# Patient Record
Sex: Female | Born: 1950 | Race: Black or African American | Hispanic: No | Marital: Single | Smoking: Never smoker
Health system: Southern US, Community
[De-identification: ages and names within clinical notes are randomized; demographics above are authoritative.]

## PROBLEM LIST (undated history)

## (undated) ENCOUNTER — Ambulatory Visit: Admission: EM | Payer: Medicare PPO | Source: Home / Self Care

## (undated) DIAGNOSIS — R42 Dizziness and giddiness: Secondary | ICD-10-CM

## (undated) DIAGNOSIS — J45909 Unspecified asthma, uncomplicated: Secondary | ICD-10-CM

## (undated) DIAGNOSIS — M810 Age-related osteoporosis without current pathological fracture: Secondary | ICD-10-CM

## (undated) DIAGNOSIS — I639 Cerebral infarction, unspecified: Secondary | ICD-10-CM

## (undated) DIAGNOSIS — I10 Essential (primary) hypertension: Secondary | ICD-10-CM

## (undated) HISTORY — PX: LUNG SURGERY: SHX703

## (undated) HISTORY — DX: Cerebral infarction, unspecified: I63.9

## (undated) HISTORY — DX: Age-related osteoporosis without current pathological fracture: M81.0

---

## 2021-10-20 ENCOUNTER — Other Ambulatory Visit: Payer: Self-pay | Admitting: Family Medicine

## 2021-10-20 DIAGNOSIS — M810 Age-related osteoporosis without current pathological fracture: Secondary | ICD-10-CM

## 2022-02-28 DIAGNOSIS — E559 Vitamin D deficiency, unspecified: Secondary | ICD-10-CM | POA: Diagnosis not present

## 2022-03-02 DIAGNOSIS — F411 Generalized anxiety disorder: Secondary | ICD-10-CM | POA: Diagnosis not present

## 2022-03-02 DIAGNOSIS — F331 Major depressive disorder, recurrent, moderate: Secondary | ICD-10-CM | POA: Diagnosis not present

## 2022-03-02 DIAGNOSIS — K219 Gastro-esophageal reflux disease without esophagitis: Secondary | ICD-10-CM | POA: Diagnosis not present

## 2022-03-02 DIAGNOSIS — G4700 Insomnia, unspecified: Secondary | ICD-10-CM | POA: Diagnosis not present

## 2022-03-02 DIAGNOSIS — E559 Vitamin D deficiency, unspecified: Secondary | ICD-10-CM | POA: Diagnosis not present

## 2022-03-02 DIAGNOSIS — I10 Essential (primary) hypertension: Secondary | ICD-10-CM | POA: Diagnosis not present

## 2022-03-03 ENCOUNTER — Inpatient Hospital Stay: Admission: RE | Admit: 2022-03-03 | Payer: Self-pay | Source: Ambulatory Visit

## 2022-04-18 DIAGNOSIS — F331 Major depressive disorder, recurrent, moderate: Secondary | ICD-10-CM | POA: Diagnosis not present

## 2022-04-18 DIAGNOSIS — I10 Essential (primary) hypertension: Secondary | ICD-10-CM | POA: Diagnosis not present

## 2022-04-18 DIAGNOSIS — G4700 Insomnia, unspecified: Secondary | ICD-10-CM | POA: Diagnosis not present

## 2022-04-18 DIAGNOSIS — F411 Generalized anxiety disorder: Secondary | ICD-10-CM | POA: Diagnosis not present

## 2022-05-22 ENCOUNTER — Emergency Department (HOSPITAL_COMMUNITY): Payer: Medicare PPO

## 2022-05-22 ENCOUNTER — Encounter (HOSPITAL_COMMUNITY): Payer: Self-pay

## 2022-05-22 ENCOUNTER — Observation Stay (HOSPITAL_COMMUNITY)
Admission: EM | Admit: 2022-05-22 | Discharge: 2022-05-23 | Disposition: A | Discharge status: Home / Self Care | Payer: Medicare PPO | Attending: Family Medicine | Admitting: Family Medicine

## 2022-05-22 ENCOUNTER — Other Ambulatory Visit: Payer: Self-pay

## 2022-05-22 ENCOUNTER — Observation Stay (HOSPITAL_COMMUNITY): Payer: Medicare PPO

## 2022-05-22 ENCOUNTER — Inpatient Hospital Stay (HOSPITAL_BASED_OUTPATIENT_CLINIC_OR_DEPARTMENT_OTHER): Payer: Medicare PPO

## 2022-05-22 DIAGNOSIS — R42 Dizziness and giddiness: Secondary | ICD-10-CM | POA: Diagnosis not present

## 2022-05-22 DIAGNOSIS — I6389 Other cerebral infarction: Secondary | ICD-10-CM

## 2022-05-22 DIAGNOSIS — R29898 Other symptoms and signs involving the musculoskeletal system: Secondary | ICD-10-CM | POA: Diagnosis not present

## 2022-05-22 DIAGNOSIS — J45909 Unspecified asthma, uncomplicated: Secondary | ICD-10-CM | POA: Insufficient documentation

## 2022-05-22 DIAGNOSIS — K219 Gastro-esophageal reflux disease without esophagitis: Secondary | ICD-10-CM | POA: Diagnosis not present

## 2022-05-22 DIAGNOSIS — I10 Essential (primary) hypertension: Secondary | ICD-10-CM | POA: Insufficient documentation

## 2022-05-22 DIAGNOSIS — R9389 Abnormal findings on diagnostic imaging of other specified body structures: Secondary | ICD-10-CM | POA: Diagnosis not present

## 2022-05-22 DIAGNOSIS — R41841 Cognitive communication deficit: Secondary | ICD-10-CM | POA: Diagnosis not present

## 2022-05-22 DIAGNOSIS — Z79899 Other long term (current) drug therapy: Secondary | ICD-10-CM | POA: Insufficient documentation

## 2022-05-22 DIAGNOSIS — R937 Abnormal findings on diagnostic imaging of other parts of musculoskeletal system: Secondary | ICD-10-CM

## 2022-05-22 DIAGNOSIS — R8281 Pyuria: Secondary | ICD-10-CM | POA: Diagnosis not present

## 2022-05-22 DIAGNOSIS — I639 Cerebral infarction, unspecified: Principal | ICD-10-CM | POA: Insufficient documentation

## 2022-05-22 DIAGNOSIS — E041 Nontoxic single thyroid nodule: Secondary | ICD-10-CM | POA: Insufficient documentation

## 2022-05-22 DIAGNOSIS — R531 Weakness: Secondary | ICD-10-CM | POA: Diagnosis not present

## 2022-05-22 DIAGNOSIS — I6522 Occlusion and stenosis of left carotid artery: Secondary | ICD-10-CM | POA: Diagnosis not present

## 2022-05-22 HISTORY — DX: Dizziness and giddiness: R42

## 2022-05-22 HISTORY — DX: Essential (primary) hypertension: I10

## 2022-05-22 HISTORY — DX: Unspecified asthma, uncomplicated: J45.909

## 2022-05-22 LAB — CBC
HCT: 44.2 % (ref 36.0–46.0)
HCT: 41.3 % (ref 36.0–46.0)
Hemoglobin: 14.2 g/dL (ref 12.0–15.0)
Hemoglobin: 13.7 g/dL (ref 12.0–15.0)
MCH: 29.5 pg (ref 26.0–34.0)
MCH: 30.5 pg (ref 26.0–34.0)
MCHC: 32.1 g/dL (ref 30.0–36.0)
MCHC: 33.2 g/dL (ref 30.0–36.0)
MCV: 91.9 fL (ref 80.0–100.0)
MCV: 92 fL (ref 80.0–100.0)
Platelets: 153 10*3/uL (ref 150–400)
Platelets: 141 10*3/uL — ABNORMAL LOW (ref 150–400)
RBC: 4.81 MIL/uL (ref 3.87–5.11)
RBC: 4.49 MIL/uL (ref 3.87–5.11)
RDW: 13.6 % (ref 11.5–15.5)
RDW: 13.5 % (ref 11.5–15.5)
WBC: 4.6 10*3/uL (ref 4.0–10.5)
WBC: 4.4 10*3/uL (ref 4.0–10.5)
nRBC: 0 % (ref 0.0–0.2)
nRBC: 0 % (ref 0.0–0.2)

## 2022-05-22 LAB — COMPREHENSIVE METABOLIC PANEL
ALT: 42 U/L (ref 0–44)
ALT: 37 U/L (ref 0–44)
AST: 30 U/L (ref 15–41)
AST: 31 U/L (ref 15–41)
Albumin: 4.1 g/dL (ref 3.5–5.0)
Albumin: 3.7 g/dL (ref 3.5–5.0)
Alkaline Phosphatase: 62 U/L (ref 38–126)
Alkaline Phosphatase: 58 U/L (ref 38–126)
Anion gap: 7 (ref 5–15)
Anion gap: 6 (ref 5–15)
BUN: 34 mg/dL — ABNORMAL HIGH (ref 8–23)
BUN: 30 mg/dL — ABNORMAL HIGH (ref 8–23)
CO2: 29 mmol/L (ref 22–32)
CO2: 28 mmol/L (ref 22–32)
Calcium: 10.3 mg/dL (ref 8.9–10.3)
Calcium: 9.6 mg/dL (ref 8.9–10.3)
Chloride: 108 mmol/L (ref 98–111)
Chloride: 108 mmol/L (ref 98–111)
Creatinine, Ser: 0.79 mg/dL (ref 0.44–1.00)
Creatinine, Ser: 0.73 mg/dL (ref 0.44–1.00)
GFR, Estimated: >60 mL/min (ref >60)
GFR, Estimated: >60 mL/min (ref >60)
Glucose, Bld: 127 mg/dL — ABNORMAL HIGH (ref 70–99)
Glucose, Bld: 86 mg/dL (ref 70–99)
Potassium: 3.7 mmol/L (ref 3.5–5.1)
Potassium: 3.8 mmol/L (ref 3.5–5.1)
Sodium: 144 mmol/L (ref 135–145)
Sodium: 142 mmol/L (ref 135–145)
Total Bilirubin: 0.6 mg/dL (ref 0.3–1.2)
Total Bilirubin: 0.8 mg/dL (ref 0.3–1.2)
Total Protein: 8.2 g/dL — ABNORMAL HIGH (ref 6.5–8.1)
Total Protein: 7.3 g/dL (ref 6.5–8.1)

## 2022-05-22 LAB — DIFFERENTIAL
Abs Immature Granulocytes: 0.01 10*3/uL (ref 0.00–0.07)
Abs Immature Granulocytes: 0.02 10*3/uL (ref 0.00–0.07)
Basophils Absolute: 0 10*3/uL (ref 0.0–0.1)
Basophils Absolute: 0 10*3/uL (ref 0.0–0.1)
Basophils Relative: 1 %
Basophils Relative: 1 %
Eosinophils Absolute: 0.2 10*3/uL (ref 0.0–0.5)
Eosinophils Absolute: 0.2 10*3/uL (ref 0.0–0.5)
Eosinophils Relative: 3 %
Eosinophils Relative: 4 %
Immature Granulocytes: 0 %
Immature Granulocytes: 1 %
Lymphocytes Relative: 27 %
Lymphocytes Relative: 30 %
Lymphs Abs: 1.3 10*3/uL (ref 0.7–4.0)
Lymphs Abs: 1.3 10*3/uL (ref 0.7–4.0)
Monocytes Absolute: 0.5 10*3/uL (ref 0.1–1.0)
Monocytes Absolute: 0.5 10*3/uL (ref 0.1–1.0)
Monocytes Relative: 10 %
Monocytes Relative: 10 %
Neutro Abs: 2.7 10*3/uL (ref 1.7–7.7)
Neutro Abs: 2.4 10*3/uL (ref 1.7–7.7)
Neutrophils Relative %: 59 %
Neutrophils Relative %: 54 %

## 2022-05-22 LAB — RAPID URINE DRUG SCREEN, HOSP PERFORMED
Amphetamines: NOT DETECTED
Barbiturates: NOT DETECTED
Benzodiazepines: NOT DETECTED
Cocaine: NOT DETECTED
Opiates: NOT DETECTED
Tetrahydrocannabinol: NOT DETECTED

## 2022-05-22 LAB — URINALYSIS, ROUTINE W REFLEX MICROSCOPIC
Bilirubin Urine: NEGATIVE
Glucose, UA: NEGATIVE mg/dL
Hgb urine dipstick: NEGATIVE
Ketones, ur: 5 mg/dL — AB
Nitrite: NEGATIVE
Protein, ur: NEGATIVE mg/dL
Specific Gravity, Urine: 1.027 (ref 1.005–1.030)
pH: 5 (ref 5.0–8.0)

## 2022-05-22 LAB — APTT: aPTT: 26 seconds (ref 24–36)

## 2022-05-22 LAB — PROTIME-INR
INR: 1.1 (ref 0.8–1.2)
Prothrombin Time: 13.9 seconds (ref 11.4–15.2)

## 2022-05-22 LAB — MAGNESIUM: Magnesium: 2.1 mg/dL (ref 1.7–2.4)

## 2022-05-22 LAB — ECHOCARDIOGRAM COMPLETE BUBBLE STUDY
AR max vel: 1.93 cm2
AV Peak grad: 9.2 mmHg
Ao pk vel: 1.52 m/s
Area-P 1/2: 4.08 cm2
Calc EF: 65.2 %
S' Lateral: 2.7 cm
Single Plane A2C EF: 67.2 %
Single Plane A4C EF: 62.5 %

## 2022-05-22 LAB — HEMOGLOBIN A1C
Hgb A1c MFr Bld: 5.3 % (ref 4.8–5.6)
Mean Plasma Glucose: 105.41 mg/dL

## 2022-05-22 LAB — LIPID PANEL
Cholesterol: 140 mg/dL (ref 0–200)
HDL: 54 mg/dL (ref >40)
LDL Cholesterol: 76 mg/dL (ref 0–99)
Total CHOL/HDL Ratio: 2.6 RATIO
Triglycerides: 48 mg/dL (ref <150)
VLDL: 10 mg/dL (ref 0–40)

## 2022-05-22 LAB — ETHANOL: Alcohol, Ethyl (B): <10 mg/dL (ref <10)

## 2022-05-22 MED ORDER — IOHEXOL 350 MG/ML SOLN
75.0000 mL | Freq: Once | INTRAVENOUS | Status: AC | PRN
  Administered 2022-05-22: 75 mL via INTRAVENOUS

## 2022-05-22 MED ORDER — ASPIRIN 325 MG PO TABS
325.0000 mg | ORAL_TABLET | Freq: Once | ORAL | Status: AC
  Administered 2022-05-22: 325 mg via ORAL
  Filled 2022-05-22: qty 1

## 2022-05-22 MED ORDER — FAMOTIDINE 20 MG PO TABS
40.0000 mg | ORAL_TABLET | Freq: Every day | ORAL | Status: DC
  Administered 2022-05-22 – 2022-05-23 (×2): 40 mg via ORAL
  Filled 2022-05-22 (×2): qty 2

## 2022-05-22 MED ORDER — STROKE: EARLY STAGES OF RECOVERY BOOK
Freq: Once | Status: DC
  Filled 2022-05-22: qty 1

## 2022-05-22 MED ORDER — HYDRALAZINE HCL 20 MG/ML IJ SOLN: 10.0000 mg | INTRAMUSCULAR | Status: DC | PRN

## 2022-05-22 MED ORDER — ACETAMINOPHEN 325 MG PO TABS: 650.0000 mg | ORAL_TABLET | Freq: Four times a day (QID) | ORAL | Status: DC | PRN

## 2022-05-22 MED ORDER — ACETAMINOPHEN 650 MG RE SUPP: 650.0000 mg | Freq: Four times a day (QID) | RECTAL | Status: DC | PRN

## 2022-05-22 NOTE — Assessment & Plan Note (Signed)
  #)   GERD: documented h/o such; on Pepcid as outpatient.   Plan: continue home Pepcid.     

## 2022-05-22 NOTE — Assessment & Plan Note (Signed)
 #)   Asymptomatic Pyuria: Presenting urinalysis suggestive of pyuria but does not meet quantitative threshold for significance in a female, Additionally, no acute urinary symptoms.  Overall, these findings appear most consistent with asymptomatic and insignificant pyuria, and do not appear to warrant independent antibiotic coverage for such.  Plan: Repeat CBC in the morning.

## 2022-05-22 NOTE — Assessment & Plan Note (Signed)
#)   Acute right upper extremity weakness: Awoke with new onset right upper extremity weakness on the morning of 05/21/2022, a/w LKN of 2200 on 05/20/22, with slight interval improvement in this symptom, but with residual weakness noted, with CT head showing no evidence of acute intracranial process, as further detailed above.  Presentation concerning for acute ischemic stroke.   Patient's case and imaging were d/w the on-call neurologist, Dr.  Thomasena Edis, who conveyed concern for potential stroke, and recommended admission to the hospitalist service for further stroke work-up, including pursuit of MRI brain, as well as further assessment of potential modifiable acute ischemic CVA risk factors. Dr. Thomasena Edis requested that the patient be admitted to [facility] for neurology will formally consult neurology to formally consult, with additional recommendations to follow.  Of note, the patient is not a candidate for tPA nor a candidate for thrombectomy given that she presents outside the window for consideration for either these interventions relative to her last known normal at 2200 on 05/20/2022, as above.  Of note, the patient reportedly possesses modifiable CVA risk factor in the form of a known history of essential hypertension.  Denies any known history of hyperlipidemia, diabetes, obstructive sleep apnea, or paroxysmal atrial fibrillation.  Additionally she is a lifelong non-smoker.  EKG has been ordered, with result currently pending.  Current outpatient antiplatelet/anticoagulant regimen: None. Current outpatient anti-lipid regimen: None.   Will allow for permissive hypertension for 48 hours following onset of acute focal neurologic deficits, with associated parameters further quantified below, and with period of observance of permissive hypertension to end at  2200 on 05/22/21.     Plan: Nursing bedside swallow evaluation x 1 now, and will not initiate oral medications or diet until the patient has passed  this. Head of the bed at 30 degrees. Neuro checks per protocol. VS per protocol. Will allow for permissive hypertension for 48 hours following LKN, during which time will hold home antihypertensive medication, with prn IV hydralazine ordered for systolic blood pressure greater than 220 mmHg or diastolic blood pressure greater than 110 mmHg until 2200 on 05/22/2022. Monitor on telemetry, including monitoring for atrial fibrillation as modifiable risk factor for acute ischemic CVA.   MRI brain has been ordered, with result currently pending.  TTE with  bubble study has been ordered for the morning. Check lipid panel and A1c. PT/OT consults have been ordered to occur in the morning.  CTA head and neck.  Full dose aspirin x1 dose now.  Neurology consulted, as above.  Follow-up result EKG, as above.
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
## 2022-05-22 NOTE — H&P (Signed)
History and Physical    PLEASE NOTE THAT DRAGON DICTATION SOFTWARE WAS USED IN THE CONSTRUCTION OF THIS NOTE.   Tamara Andrews SJG:[id number] DOB: 05/08/1961 DOA: 05/22/2022  PCP: Lewis Moccasin, MD  Patient coming from: home   I have personally briefly reviewed patient's old medical records in [facility]  Chief Complaint: Right upper extremity weakness  HPI: Tamara Andrews is a 71 y.o. female with medical history significant for essential hypertension, GERD, who is admitted to [facility] on 05/22/2022 for further stroke work-up after presenting from home to [facility] ED complaining of right upper extremity weakness.   The patient reports that she awoke around 8 AM on Saturday, 05/21/2022 with new onset right upper extremity weakness, noting that this was not present when she went to sleep around 2200 on 6-23.  Since first noting the right upper extremity weakness, she notes a small amount of interval improvement, but notes that she has not yet back to baseline as it relates to this.  Not associated with any acute focal numbness or paresthesias.  Denies any additional acute focal weakness.  Not associate with any dysphagia, dizziness, vertigo, nausea, vomiting, change in vision, blurry vision, diplopia, word finding difficulties, slurring of speech, facial droop, or headache.    Denies any associated chest pain, shortness of breath, palpitations, diaphoresis, presyncope, or syncope.  Denies any recent subjective fever, chills, rigors, or generalized myalgias.  No recent dysuria, gross hematuria, or change in urinary  Denies any know previous history of stroke, although she reports a history of multiple previous vertigo episodes several months to years ago.  Medical history notable for essential hypertension, although patient denies any known history of hyperlipidemia, diabetes, paroxysmal atrial fibrillation, obstructive sleep apnea, and confirms that she is a lifelong non-smoker.  She  notes compliance with her outpatient valsartan, and confirms that she is on no antiplatelet or anticoagulation medications at home, including no aspirin.  Not currently taking a statin.     ED Course:  Vital signs in the ED were notable for the following: Afebrile; heart rate 55; blood pressure initially noted to be 181/76, with most recent blood pressure 144/89 without interval antihypertensive medications; respiratory rate 15-19, oxygen saturation 97 to 99% on  Labs were notable for the following: CMP notable for the following: Creatinine 0.79, glucose 127, liver enzymes found to be within normal limits.  CBC notable for will with cell count 4600, hemoglobin 14.2, platelet count 153.  INR 1.1.  Urinalysis notable for 11-20 white blood cells, rare bacteria, nitrate negative, specific gravity 1.027.  Serum ethanol level less than 10.  Urinary drug screen ordered, with result currently pending.  Imaging and additional notable ED work-up: EKG ordered, with result currently pending.  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   EDP discussed patient's case and imaging with the on-call neurologist, Dr.  Thomasena Edis, who conveyed concern for potential stroke, and recommended admission to the hospitalist service for further stroke work-up, including pursuit of MRI brain, as well as further assessment of potential modifiable acute ischemic CVA risk factors. Dr. Thomasena Edis requested that the patient be admitted to [facility] for neurology will formally consult neurology to formally consult, with additional recommendations to follow.  Subsequently, the patient was admitted to [facility] for further stroke evaluation.      Review of Systems:  As per HPI otherwise 10 point review of systems negative.   Past Medical  History:  Diagnosis Date   Asthma    Essential hypertension    Vertigo     Past Surgical History:  Procedure Laterality Date   LUNG SURGERY      Social History:  reports that she has never smoked. She has never used smokeless tobacco. She reports that she does not currently use alcohol. She reports that she does not use drugs.   No Known Allergies  History reviewed. No pertinent family history.   Prior to Admission medications   Medication Sig Start Date End Date Taking? Authorizing Provider  famotidine (PEPCID) 40 MG tablet Take 40 mg by mouth daily. 02/11/22  Yes [provider]  sertraline (ZOLOFT) 50 MG tablet Take 50 mg by mouth every morning. 04/18/22  Yes [provider]  valsartan (DIOVAN) 160 MG tablet Take 240 mg by mouth daily. 1.5 tablets   Yes [provider]  VITAMIN D PO Take 1 tablet by mouth daily.   Yes [provider]     Objective    Physical Exam: Vitals:   05/22/22 0159 05/22/22 0203 05/22/22 0208 05/22/22 0300  BP: (!) 181/76 (!) 181/118 (!) 156/85 (!) 144/89  Pulse: (!) 55 (!) 50  (!) 55  Resp: (!) 23 16  15   Temp:  (!) 97.4 F (36.3 C)    TempSrc:  Oral    SpO2: 97% 99%  99%  Weight:      Height:        General: appears to be stated age; alert, oriented Skin: warm, dry, no rash Head:  AT/[place] Mouth:  Oral mucosa membranes appear moist, normal dentition Neck: supple; trachea midline Heart:  RRR; did not appreciate any M/R/G Lungs: CTAB, did not appreciate any wheezes, rales, or rhonchi Abdomen: + BS; soft, ND, NT Vascular: 2+ pedal pulses b/l; 2+ radial pulses b/l Extremities: no peripheral edema, no muscle wasting Neuro: 5/5 strength in the bilateral lower extremities as well as the lue; 4/5 strength in RUE;  sensation intact in upper and lower extremities b/l; cranial nerves II through XII grossly intact; no pronator drift; no evidence suggestive of slurred speech, dysarthria, or facial droop; Normal  muscle tone. No tremors.    Labs on Admission: I have personally reviewed following labs and imaging studies  CBC: Recent Labs  Lab 05/22/22 0123  WBC 4.6  NEUTROABS 2.7  HGB 14.2  HCT 44.2  MCV 91.9  PLT 153   Basic Metabolic Panel: Recent Labs  Lab 05/22/22 0123  NA 144  K 3.7  CL 108  CO2 29  GLUCOSE 127*  BUN 34*  CREATININE 0.79  CALCIUM 10.3   GFR: Estimated Creatinine Clearance: 56.9 mL/min (by C-G formula based on SCr of 0.79 mg/dL). Liver Function Tests: Recent Labs  Lab 05/22/22 0123  AST 30  ALT 42  ALKPHOS 62  BILITOT 0.6  PROT 8.2*  ALBUMIN 4.1   No results for input(s): LIPASE, AMYLASE in the last 168 hours. No results for input(s): AMMONIA in the last 168 hours. Coagulation Profile: Recent Labs  Lab 05/22/22 0123  INR 1.1   Cardiac Enzymes: No results for input(s): CKTOTAL, CKMB, CKMBINDEX, TROPONINI in the last 168 hours. BNP (last 3 results) No results for input(s): PROBNP in the last 8760 hours. HbA1C: No results for input(s): HGBA1C in the last 72 hours. CBG: No results for input(s): GLUCAP in the last 168 hours. Lipid Profile: No  results for input(s): CHOL, HDL, LDLCALC, TRIG, CHOLHDL, LDLDIRECT in the last 72 hours. Thyroid Function Tests: No results for input(s): TSH, T4TOTAL, FREET4, T3FREE, THYROIDAB in the last 72 hours. Anemia Panel: No results for input(s): VITAMINB12, FOLATE, FERRITIN, TIBC, IRON, RETICCTPCT in the last 72 hours. Urine analysis:    Component Value Date/Time   COLORURINE YELLOW 05/22/2022 0153   APPEARANCEUR CLOUDY (A) 05/22/2022 0153   LABSPEC 1.027 05/22/2022 0153   PHURINE 5.0 05/22/2022 0153   GLUCOSEU NEGATIVE 05/22/2022 0153   HGBUR NEGATIVE 05/22/2022 0153   BILIRUBINUR NEGATIVE 05/22/2022 0153   KETONESUR 5 (A) 05/22/2022 0153   PROTEINUR NEGATIVE 05/22/2022 0153   NITRITE NEGATIVE 05/22/2022 0153   LEUKOCYTESUR SMALL (A) 05/22/2022 0153    Radiological Exams on Admission: CT HEAD WO  CONTRAST  Result Date: 05/22/2022 CLINICAL DATA:  Dizziness with right extremity weakness. EXAM: CT HEAD WITHOUT CONTRAST TECHNIQUE: Contiguous axial images were obtained from the base of the skull through the vertex without intravenous contrast. RADIATION DOSE REDUCTION: This exam was performed according to the departmental dose-optimization program which includes automated exposure control, adjustment of the mA and/or kV according to patient size and/or use of iterative reconstruction technique. COMPARISON:  None Available. FINDINGS: Brain: There is mild cerebral atrophy with widening of the extra-axial spaces and ventricular dilatation. There are areas of decreased attenuation within the white matter tracts of the supratentorial brain, consistent with microvascular disease changes. 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 Vascular: No hyperdense vessel or unexpected calcification. Skull: Normal. Negative for fracture or focal lesion. Sinuses/Orbits: No acute finding. Other: None. IMPRESSION: 1. Generalized cerebral atrophy and chronic white matter small vessel ischemic changes without evidence of an acute intracranial abnormality. 2. 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 MRI correlation is recommended. Electronically Signed   By: Aram Candela M.D.   On: 05/22/2022 01:52      Assessment/Plan    Principal Problem:   Weakness of right upper extremity Active Problems:   Essential hypertension   Pyuria   GERD (gastroesophageal reflux disease)       #) Acute right upper extremity weakness: Awoke with new onset right upper extremity weakness on the morning of 05/21/2022, a/w LKN of 2200 on 05/20/22, with slight interval improvement in this symptom, but with residual weakness noted, with CT head showing no evidence of acute intracranial process, as further  detailed above.  Presentation concerning for acute ischemic stroke.   Patient's case and imaging were d/w the on-call neurologist, Dr.  Thomasena Edis, who conveyed concern for potential stroke, and recommended admission to the hospitalist service for further stroke work-up, including pursuit of MRI brain, as well as further assessment of potential modifiable acute ischemic CVA risk factors. Dr. Thomasena Edis requested that the patient be admitted to [facility] for neurology will formally consult neurology to formally consult, with additional recommendations to follow.  Of note, the patient is not a candidate for tPA nor a candidate for thrombectomy given that she presents outside the window for consideration for either these interventions relative to her last known normal at 2200 on 05/20/2022, as above.  Of note, the patient reportedly possesses modifiable CVA risk factor in the form of a known history of essential hypertension.  Denies any known history of hyperlipidemia, diabetes, obstructive sleep apnea, or paroxysmal atrial fibrillation.  Additionally she is a lifelong non-smoker.  EKG has been ordered, with  result currently pending.  Current outpatient antiplatelet/anticoagulant regimen: None. Current outpatient anti-lipid regimen: None.   Will allow for permissive hypertension for 48 hours following onset of acute focal neurologic deficits, with associated parameters further quantified below, and with period of observance of permissive hypertension to end at  2200 on 05/22/21.     Plan: Nursing bedside swallow evaluation x 1 now, and will not initiate oral medications or diet until the patient has passed this. Head of the bed at 30 degrees. Neuro checks per protocol. VS per protocol. Will allow for permissive hypertension for 48 hours following LKN, during which time will hold home antihypertensive medication, with prn IV hydralazine ordered for systolic blood pressure greater than 220 mmHg or diastolic blood  pressure greater than 110 mmHg until 2200 on 05/22/2022. Monitor on telemetry, including monitoring for atrial fibrillation as modifiable risk factor for acute ischemic CVA.   MRI brain has been ordered, with result currently pending.  TTE with  bubble study has been ordered for the morning. Check lipid panel and A1c. PT/OT consults have been ordered to occur in the morning.  CTA head and neck.  Full dose aspirin x1 dose now.  Neurology consulted, as above.  Follow-up result EKG, as above.         #) Asymptomatic Pyuria: Presenting urinalysis suggestive of pyuria but does not meet quantitative threshold for significance in a female, Additionally, no acute urinary symptoms.  Overall, these findings appear most consistent with asymptomatic and insignificant pyuria, and do not appear to warrant independent antibiotic coverage for such.  Plan: Repeat CBC in the morning.           #) Essential Hypertension: documented h/o such, with outpatient antihypertensive regimen including valsartan.  In the context of concern for presenting acute ischemic CVA, observing.  Hours of permissive hypertension, as above, during which time will hold home valsartan and pursue prn IV hydralazine, with parameters as outlined above.  Plan: Close monitoring of subsequent BP via routine VS. holding home valsartan for now.  Permissive hypertension until 2200 on 05/22/2022.  As needed IV hydralazine, as above.             #) GERD: documented h/o such; on Pepcid as outpatient.   Plan: continue home Pepcid.          DVT prophylaxis: SCD's   Code Status: Full code Family Communication: none Disposition Plan: Per Rounding Team Consults called: case/imaging discussed with on-call neurology, Dr. Thomasena Edis, as above;  Admission status: observation to [facility]   PLEASE NOTE THAT DRAGON DICTATION SOFTWARE WAS USED IN THE CONSTRUCTION OF THIS NOTE.   Chaney Born Jaedon Siler DO [facility] From 7PM -  7AM   05/22/2022, 4:34 AM

## 2022-05-22 NOTE — ED Triage Notes (Signed)
Patient said her right hand dropped everything while she was eating. This happened around 12pm yesterday. She said 2 nights ago she had vertigo. The whole room was spinning. No changes in speech, no blurred vision, no gait or balance issues.

## 2022-05-22 NOTE — ED Notes (Signed)
Report given to [facility] 

## 2022-05-22 NOTE — Evaluation (Signed)
Physical Therapy Evaluation Patient Details Name: Tamara Andrews MRN: [id number] DOB: 13-Feb-1951 Today's Date: 05/22/2022  History of Present Illness  Tamara Andrews is a 71 y.o. female with medical history significant for essential hypertension, GERD, who is admitted to [facility] on 05/22/2022 for further stroke work-up after presenting from home to [facility] ED complaining of right upper extremity weakness.  Clinical Impression  [name] is a  71 year old woman who presents with acute CVA. She lives alone, is independent, and is a retired Statistician. Pt demonstrates independence with all transfers and mobility tasks including ambulation in halls with side stepping, back stepping, stork standing, braided walking and tandem step performed with no to minimal difficulty.  Pt states her mobility is at base line and agrees with no PT needs at this time - PT services will sign off at this time.  Therapist did have some concerns in regards to cognition. Alert and oriented x 4 but had some trouble initially with the President. At times mild trouble finding correct word. When asked about birth date, birth year and then her age - she got her age wrong by 10 years and potentially maybe the birth year as she stated three different numbers. She was able to do simple math but wanted therapist to repeat the questions. Patient lives alone and manages her own medication and finances. Pt would benefit from cognitive evaluation to make sure she is able to do so correctly on discharge.     Recommendations for follow up therapy are one component of a multi-disciplinary discharge planning process, led by the attending physician.  Recommendations may be updated based on patient status, additional functional criteria and insurance authorization.  Follow Up Recommendations No PT follow up    Assistance Recommended at Discharge None  Patient can return home with the following       Equipment  Recommendations    Recommendations for Other Services       Functional Status Assessment       Precautions / Restrictions Precautions Precautions: None Restrictions Weight Bearing Restrictions: No      Mobility  Bed Mobility Overal bed mobility: Modified Independent                  Transfers Overall transfer level: Independent Equipment used: None                    Ambulation/Gait Ambulation/Gait assistance: Supervision, Independent Gait Distance (Feet): 450 Feet Assistive device: None Gait Pattern/deviations: Step-through pattern, WFL(Within Functional Limits) Gait velocity: mod pace        Stairs            Wheelchair Mobility    Modified Rankin (Stroke Patients Only)       Balance Overall balance assessment: Independent                                           Pertinent Vitals/Pain Pain Assessment Pain Assessment: No/denies pain    Home Living Family/patient expects to be discharged to:: Private residence Living Arrangements: Alone   Type of Home: House Home Access: Level entry     Alternate Level Stairs-Number of Steps: 10 Home Layout: Multi-level;Able to live on main level with bedroom/bathroom Home Equipment: None      Prior Function Prior Level of Function : Independent/Modified Independent  Hand Dominance   Dominant Hand: Right    Extremity/Trunk Assessment   Upper Extremity Assessment Upper Extremity Assessment: RUE deficits/detail;Defer to OT evaluation RUE Deficits / Details: WNL ROM, 5/5 shoulder, 5/5 Bicep, 5/5 tricep, LUE Deficits / Details: WNL ROM, 4/5 shoulder strength (reported pain in back of shoulder with resistance) LUE Sensation: WNL LUE Coordination: WNL    Lower Extremity Assessment Lower Extremity Assessment: Overall WFL for tasks assessed    Cervical / Trunk Assessment Cervical / Trunk Assessment: Normal  Communication   Communication:  No difficulties  Cognition Arousal/Alertness: Awake/alert Behavior During Therapy: WFL for tasks assessed/performed Overall Cognitive Status: Within Functional Limits for tasks assessed                                 General Comments: Functional - able to follow commands but some questionable answers, stalling, increased time and some mild word finding difficulties. WIth answering birthdate and age - patient messed up and stated a couple of numbers. York Spaniel she was 71 instead of 60.        General Comments      Exercises     Assessment/Plan    PT Assessment Patient does not need any further PT services  PT Problem List         PT Treatment Interventions      PT Goals (Current goals can be found in the Care Plan section)  Acute Rehab PT Goals PT Goal Formulation: All assessment and education complete, DC therapy    Frequency       Co-evaluation               AM-PAC PT "6 Clicks" Mobility  Outcome Measure Help needed turning from your back to your side while in a flat bed without using bedrails?: None Help needed moving from lying on your back to sitting on the side of a flat bed without using bedrails?: None Help needed moving to and from a bed to a chair (including a wheelchair)?: None Help needed standing up from a chair using your arms (e.g., wheelchair or bedside chair)?: None Help needed to walk in hospital room?: None Help needed climbing 3-5 steps with a railing? : None 6 Click Score: 24    End of Session Equipment Utilized During Treatment: Gait belt Activity Tolerance: Patient tolerated treatment well Patient left: in bed;with call bell/phone within reach Nurse Communication: Mobility status PT Visit Diagnosis: Difficulty in walking, not elsewhere classified (R26.2)    Time: 9675-9163 PT Time Calculation (min) (ACUTE ONLY): 25 min   Charges:   PT Evaluation $PT Eval Low Complexity: 1 Low          Mauro Kaufmann PT Acute  Rehabilitation Services Pager [phone] Office [phone]   Chrishaun Sasso 05/22/2022, 12:43 PM

## 2022-05-22 NOTE — ED Provider Notes (Signed)
[facility]-EMERGENCY DEPT Provider Note   CSN: [id number] Arrival date & time: 05/22/22  0010     History  Chief Complaint  Patient presents with   Dizziness    Tamara Andrews is a 71 y.o. female.  The history is provided by the patient.  Dizziness Tamara Andrews is a 70 y.o. female who presents to the Emergency Department complaining of weakness.  She presents to the emergency department complaining of weakness with her right arm that she noticed when she woke up this morning.  Her arm was normal when she went to bed late Friday night.  She does report having a longstanding issue with vertigo and over the last 2 nights she has experienced episodic vertigo symptoms.  The weakness in the right arm has been constant.  No reports of pain or recent injuries.  She first noticed it when she was in the shower and kept dropping a wash rag which she attempted to pick it up.  She continues to have difficulty using her right hand.  She is right-hand dominant.  She has no known medical problems.  She is retired Airline pilot.    Home Medications Prior to Admission medications   Medication Sig Start Date End Date Taking? Authorizing Provider  famotidine (PEPCID) 40 MG tablet Take 40 mg by mouth daily. 02/11/22  Yes [provider]  sertraline (ZOLOFT) 50 MG tablet Take 50 mg by mouth every morning. 04/18/22  Yes [provider]  valsartan (DIOVAN) 160 MG tablet Take 240 mg by mouth daily. 1.5 tablets   Yes [provider]  VITAMIN D PO Take 1 tablet by mouth daily.   Yes [provider]      Allergies    Patient has no known allergies.    Review of Systems   Review of Systems  Neurological:  Positive for dizziness.  All other systems reviewed and are negative.  Physical Exam Updated Vital Signs BP (!) 156/85   Pulse (!) 50   Temp (!) 97.4 F (36.3 C) (Oral)   Resp 16   Ht 5\' 2"  (1.575 m)   Wt 51.3 kg   SpO2 99%   BMI 20.67 kg/m   Physical Exam Vitals and nursing note reviewed.  Constitutional:      Appearance: She is well-developed.  HENT:     Head: Normocephalic and atraumatic.  Cardiovascular:     Rate and Rhythm: Normal rate and regular rhythm.     Heart sounds: Murmur heard.  Pulmonary:     Effort: Pulmonary effort is normal. No respiratory distress.     Breath sounds: Normal breath sounds.  Abdominal:     Palpations: Abdomen is soft.     Tenderness: There is no abdominal tenderness. There is no guarding or rebound.  Musculoskeletal:        General: No tenderness.  Skin:    General: Skin is warm and dry.  Neurological:     Mental Status: She is alert and oriented to person, place, and time.     Comments: No asymmetry of facial movements.  5 out of 5 strength in all 4 extremities.  Visual fields grossly intact.  No pronator drift.   Psychiatric:        Behavior: Behavior normal.    ED Results / Procedures / Treatments   Labs (all labs ordered are listed, but only abnormal results are displayed) Labs Reviewed  ETHANOL  PROTIME-INR  APTT  CBC  DIFFERENTIAL  COMPREHENSIVE METABOLIC PANEL  RAPID URINE DRUG SCREEN, HOSP PERFORMED  URINALYSIS, ROUTINE W REFLEX MICROSCOPIC    EKG None  Radiology CT HEAD WO CONTRAST  Result Date: 05/22/2022 CLINICAL DATA:  Dizziness with right extremity weakness. EXAM: CT HEAD WITHOUT CONTRAST TECHNIQUE: Contiguous axial images were obtained from the base of the skull through the vertex without intravenous contrast. RADIATION DOSE REDUCTION: This exam was performed according to the departmental dose-optimization program which includes automated exposure control, adjustment of the mA and/or kV according to patient size and/or use of iterative reconstruction technique. COMPARISON:  None Available. FINDINGS: Brain: There is mild cerebral atrophy with widening of the extra-axial spaces and ventricular dilatation. There are areas of decreased attenuation within the white  matter tracts of the supratentorial brain, consistent with microvascular disease changes. 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 Vascular: No hyperdense vessel or unexpected calcification. Skull: Normal. Negative for fracture or focal lesion. Sinuses/Orbits: No acute finding. Other: None. IMPRESSION: 1. Generalized cerebral atrophy and chronic white matter small vessel ischemic changes without evidence of an acute intracranial abnormality. 2. 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 MRI correlation is recommended. Electronically Signed   By: Aram Candela M.D.   On: 05/22/2022 01:52    Procedures Procedures    Medications Ordered in ED Medications - No data to display  ED Course/ Medical Decision Making/ A&P                           Medical Decision Making Amount and/or Complexity of Data Reviewed Labs: ordered. Radiology: ordered.  Risk Decision regarding hospitalization.   Patient here for evaluation of new onset right upper extremity weakness.  She does have a longstanding history of vertigo, and states this has been a little worse than baseline over the last 2 days.  On examination she is fairly symmetric in terms of strength, but given she is right-hand dominant suspect this is weaker than her baseline.  CT scan demonstrates bilateral cerebellar infarcts as well as left frontal lobe infarct that are chronic.  Patient was unaware of prior stroke and has not had prior stroke work-up.  MRI is not currently available at this facility at this hour.  Given her multiple prior strokes and concern for new stroke recommend admission for ongoing work-up.  Discussed with patient findings of studies and recommendation for admission and she is in agreement with plan.  Hospitalist consulted for admission.        Final Clinical Impression(s) / ED  Diagnoses Final diagnoses:  None    Rx / DC Orders ED Discharge Orders     None         Tilden Fossa, MD 05/22/22 [phone]

## 2022-05-22 NOTE — Hospital Course (Signed)
71 y.o. female with medical history significant for essential hypertension, GERD, who is admitted to [facility] on 05/22/2022 for further stroke work-up after presenting from home to [facility] ED complaining of right upper extremity weakness. MRI confirmed a subtle acute cortical/subcortical infarct in the L superior perirolandic cortex

## 2022-05-22 NOTE — ED Notes (Signed)
After obtaining a pretty high bp I adjusted the bp cuff and retook bp and even tho the bp is still htn it is a little better

## 2022-05-22 NOTE — ED Notes (Signed)
Patient said that she never received her home medications today. RN messaged floor coverage Dr. Toniann Fail to please order.

## 2022-05-22 NOTE — Assessment & Plan Note (Signed)
 #)   Essential Hypertension: documented h/o such, with outpatient antihypertensive regimen including valsartan.  In the context of concern for presenting acute ischemic CVA, observing.  Hours of permissive hypertension, as above, during which time will hold home valsartan and pursue prn IV hydralazine, with parameters as outlined above.  Plan: Close monitoring of subsequent BP via routine VS. holding home valsartan for now.  Permissive hypertension until 2200 on 05/22/2022.  As needed IV hydralazine, as above.

## 2022-05-22 NOTE — Progress Notes (Signed)
  Progress Note   Patient: Tamara Andrews [id number] DOB: 05/08/1961 DOA: 05/22/2022     0 DOS: the patient was seen and examined on 05/22/2022   Brief hospital course: 71 y.o. female with medical history significant for essential hypertension, GERD, who is admitted to [facility] on 05/22/2022 for further stroke work-up after presenting from home to [facility] ED complaining of right upper extremity weakness. MRI confirmed a subtle acute cortical/subcortical infarct in the L superior perirolandic cortex  Assessment and Plan: Acute CVA -Acute RUE weakness with dropping objects in R hand that began around 11am 6/3 -MRI brain notable for subtle acute cortical/subcortical infarct in L superior perirolandic cortex noted -RUE weakness improved today -Seen by PT/OT, no needs identified -2d echo reviewed, findings of normal LVEF with now WMA. 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 -Neuro to follow on tx to [facility]  Pyuria -Asymptomatic -Afebrile  HTN -BP stable at this time -Valsartan on hold to allow permissive HTN  GERD -Stable at this time      Subjective: States R hand weakness has improved this AM  Physical Exam: Vitals:   05/22/22 0208 05/22/22 0300 05/22/22 0650 05/22/22 0652  BP: (!) 156/85 (!) 144/89 129/73   Pulse:  (!) 55 (!) 53   Resp:  15 19   Temp:      TempSrc:      SpO2:  99% 98% 98%  Weight:      Height:       General exam: Awake, laying in bed, in nad Respiratory system: Normal respiratory effort, no wheezing Cardiovascular system: regular rate, s1, s2 Gastrointestinal system: Soft, nondistended, positive BS Central nervous system: CN2-12 grossly intact, strength intact Extremities: Perfused, no clubbing Skin: Normal skin turgor, no notable skin lesions seen Psychiatry: Mood normal // no visual hallucinations   Data Reviewed:  MRI reviewed: subacute acute infarct in L superior perirolandic cortex  Family Communication: Pt in room, family not  at bedside  Disposition: Status is: Inpatient Remains inpatient appropriate because: Severity of illness  Planned Discharge Destination: Home     Author: Marylu Lund, MD 05/22/2022 10:38 AM  For on call review [email or web address].

## 2022-05-22 NOTE — Evaluation (Addendum)
Occupational Therapy Evaluation Patient Details Name: Tamara Andrews MRN: [id number] DOB: 05/08/1961 Today's Date: 05/22/2022   History of Present Illness Tamara Andrews is a 71 y.o. female with medical history significant for essential hypertension, GERD, who is admitted to [facility] on 05/22/2022 for further stroke work-up after presenting from home to [facility] ED complaining of right upper extremity weakness.   Clinical Impression   [name] is a  71 year old woman who presents with acute CVA. She lives alone, is independent, and is a retired Statistician. On evaluation she demonstrates very mild ataxia of RUE and decreased in right hand. She is able to perform all ADLs independently but with increased time. She has no visual deficits. She has no sensation deficits. Her handwriting is legible but very slow and different than her baseline. She is independent with her bed transfers and ambulation with no balance deficits. Therapist did have some concerns in regards to cognition. Alert and oriented x 4 but had some trouble initially with the President. At times mild trouble finding correct word. When asked about birth date, birth year and then her age - she got her age wrong by 10 years and potentially maybe the birth year as she stated three different numbers. She was able to do simple math but wanted therapist to repeat the questions. Patient lives alone and manages her own medication and finances. Will need cognition evaluation to make sure she is able to do so correctly on discharge.  From an OT standpoint patient is functional and has no therapy needs. Patient instructed on ways to improve RUE deficits and handwriting. Patient verbalizes understanding.     Recommendations for follow up therapy are one component of a multi-disciplinary discharge planning process, led by the attending physician.  Recommendations may be updated based on patient status, additional functional  criteria and insurance authorization.   Follow Up Recommendations  No OT follow up    Assistance Recommended at Discharge None  Patient can return home with the following      Functional Status Assessment  Patient has not had a recent decline in their functional status  Equipment Recommendations  None recommended by OT    Recommendations for Other Services       Precautions / Restrictions Precautions Precautions: None Restrictions Weight Bearing Restrictions: No      Mobility Bed Mobility Overal bed mobility: Independent                  Transfers Overall transfer level: Independent                        Balance Overall balance assessment: Independent                                         ADL either performed or assessed with clinical judgement   ADL Overall ADL's : Modified independent                                       General ADL Comments: Increased time for use of right dominant upper extremity but able to perform without assistance.     Vision Baseline Vision/History: 1 Wears glasses Patient Visual Report: No change from baseline       Perception  Praxis      Pertinent Vitals/Pain Pain Assessment Pain Assessment: No/denies pain     Hand Dominance Right   Extremity/Trunk Assessment Upper Extremity Assessment Upper Extremity Assessment: RUE deficits/detail;LUE deficits/detail RUE Deficits / Details: WNL ROM, 5/5 shoulder, 5/5 Bicep, 5/5 tricep, LUE Deficits / Details: WNL ROM, 4/5 shoulder strength (reported pain in back of shoulder with resistance) LUE Sensation: WNL LUE Coordination: WNL   Lower Extremity Assessment Lower Extremity Assessment: Defer to PT evaluation   Cervical / Trunk Assessment Cervical / Trunk Assessment: Normal   Communication Communication Communication: No difficulties   Cognition Arousal/Alertness: Awake/alert Behavior During Therapy: WFL for tasks  assessed/performed Overall Cognitive Status: Within Functional Limits for tasks assessed                                 General Comments: Functional - able to follow commands but some questionable answers, stalling, increased time and some mild word finding difficulties. WIth answering birthdate and age - patient messed up and stated a couple of numbers. York Spaniel she was 71 instead of 60.     General Comments       Exercises     Shoulder Instructions      Home Living Family/patient expects to be discharged to:: Private residence Living Arrangements: Alone   Type of Home: House Home Access: Level entry     Home Layout: Multi-level;Able to live on main level with bedroom/bathroom Alternate Level Stairs-Number of Steps: 10   Bathroom Shower/Tub: Producer, television/film/video: Standard     Home Equipment: None          Prior Functioning/Environment Prior Level of Function : Independent/Modified Independent                        OT Problem List: Decreased coordination;Decreased cognition      OT Treatment/Interventions:      OT Goals(Current goals can be found in the care plan section) Acute Rehab OT Goals OT Goal Formulation: All assessment and education complete, DC therapy  OT Frequency:      Co-evaluation              AM-PAC OT "6 Clicks" Daily Activity     Outcome Measure Help from another person eating meals?: None Help from another person taking care of personal grooming?: None Help from another person toileting, which includes using toliet, bedpan, or urinal?: None Help from another person bathing (including washing, rinsing, drying)?: None Help from another person to put on and taking off regular upper body clothing?: None Help from another person to put on and taking off regular lower body clothing?: None 6 Click Score: 24   End of Session Nurse Communication:  (okay to see)  Activity Tolerance: Patient tolerated  treatment well Patient left: in bed  OT Visit Diagnosis: Hemiplegia and hemiparesis Hemiplegia - Right/Left: Right Hemiplegia - dominant/non-dominant: Dominant Hemiplegia - caused by: Cerebral infarction                Time: 5027-7412 OT Time Calculation (min): 23 min Charges:  OT General Charges $OT Visit: 1 Visit OT Evaluation $OT Eval Low Complexity: 1 Low  Shalen Petrak, OTR/L Acute Care Rehab Services  Office [phone] Pager: [phone]   Kelli Churn 05/22/2022, 10:53 AM

## 2022-05-22 NOTE — ED Notes (Signed)
[facility] called for transport. 

## 2022-05-22 NOTE — ED Notes (Signed)
Swallow screen passed with no issues

## 2022-05-23 ENCOUNTER — Inpatient Hospital Stay (HOSPITAL_BASED_OUTPATIENT_CLINIC_OR_DEPARTMENT_OTHER): Payer: Medicare PPO

## 2022-05-23 ENCOUNTER — Other Ambulatory Visit (HOSPITAL_COMMUNITY): Payer: Self-pay

## 2022-05-23 DIAGNOSIS — R937 Abnormal findings on diagnostic imaging of other parts of musculoskeletal system: Secondary | ICD-10-CM | POA: Diagnosis not present

## 2022-05-23 DIAGNOSIS — M7989 Other specified soft tissue disorders: Secondary | ICD-10-CM

## 2022-05-23 DIAGNOSIS — I639 Cerebral infarction, unspecified: Secondary | ICD-10-CM

## 2022-05-23 DIAGNOSIS — I10 Essential (primary) hypertension: Secondary | ICD-10-CM | POA: Diagnosis not present

## 2022-05-23 DIAGNOSIS — R8281 Pyuria: Secondary | ICD-10-CM | POA: Diagnosis not present

## 2022-05-23 DIAGNOSIS — E041 Nontoxic single thyroid nodule: Secondary | ICD-10-CM | POA: Diagnosis not present

## 2022-05-23 DIAGNOSIS — R41841 Cognitive communication deficit: Secondary | ICD-10-CM | POA: Diagnosis not present

## 2022-05-23 DIAGNOSIS — R9389 Abnormal findings on diagnostic imaging of other specified body structures: Secondary | ICD-10-CM | POA: Diagnosis not present

## 2022-05-23 DIAGNOSIS — Z79899 Other long term (current) drug therapy: Secondary | ICD-10-CM | POA: Diagnosis not present

## 2022-05-23 DIAGNOSIS — J45909 Unspecified asthma, uncomplicated: Secondary | ICD-10-CM | POA: Diagnosis not present

## 2022-05-23 LAB — COMPREHENSIVE METABOLIC PANEL
ALT: 27 U/L (ref 0–44)
AST: 22 U/L (ref 15–41)
Albumin: 3 g/dL — ABNORMAL LOW (ref 3.5–5.0)
Alkaline Phosphatase: 51 U/L (ref 38–126)
Anion gap: 5 (ref 5–15)
BUN: 13 mg/dL (ref 8–23)
CO2: 26 mmol/L (ref 22–32)
Calcium: 9.3 mg/dL (ref 8.9–10.3)
Chloride: 109 mmol/L (ref 98–111)
Creatinine, Ser: 0.8 mg/dL (ref 0.44–1.00)
GFR, Estimated: >60 mL/min (ref >60)
Glucose, Bld: 81 mg/dL (ref 70–99)
Potassium: 4.3 mmol/L (ref 3.5–5.1)
Sodium: 140 mmol/L (ref 135–145)
Total Bilirubin: 0.6 mg/dL (ref 0.3–1.2)
Total Protein: 6.5 g/dL (ref 6.5–8.1)

## 2022-05-23 LAB — CBC
HCT: 38.6 % (ref 36.0–46.0)
Hemoglobin: 12.9 g/dL (ref 12.0–15.0)
MCH: 30.1 pg (ref 26.0–34.0)
MCHC: 33.4 g/dL (ref 30.0–36.0)
MCV: 90.2 fL (ref 80.0–100.0)
Platelets: 135 10*3/uL — ABNORMAL LOW (ref 150–400)
RBC: 4.28 MIL/uL (ref 3.87–5.11)
RDW: 13.3 % (ref 11.5–15.5)
WBC: 3.7 10*3/uL — ABNORMAL LOW (ref 4.0–10.5)
nRBC: 0 % (ref 0.0–0.2)

## 2022-05-23 MED ORDER — CLOPIDOGREL BISULFATE 75 MG PO TABS
75.0000 mg | ORAL_TABLET | Freq: Every day | ORAL | 0 refills | Status: DC
  Filled 2022-05-23: qty 21, 21d supply, fill #0

## 2022-05-23 MED ORDER — ATORVASTATIN CALCIUM 10 MG PO TABS
10.0000 mg | ORAL_TABLET | Freq: Every day | ORAL | 11 refills | Status: AC
  Filled 2022-05-23: qty 30, 30d supply, fill #0

## 2022-05-23 MED ORDER — CLOPIDOGREL BISULFATE 75 MG PO TABS
ORAL_TABLET | ORAL | Status: AC
  Filled 2022-05-23: qty 4

## 2022-05-23 MED ORDER — ASPIRIN 81 MG PO CHEW
81.0000 mg | CHEWABLE_TABLET | Freq: Every day | ORAL | 11 refills | Status: AC
  Filled 2022-05-23: qty 30, 30d supply, fill #0

## 2022-05-23 MED ORDER — ASPIRIN 81 MG PO CHEW
CHEWABLE_TABLET | ORAL | Status: AC
  Filled 2022-05-23: qty 1

## 2022-05-23 MED ORDER — ASPIRIN 81 MG PO CHEW
81.0000 mg | CHEWABLE_TABLET | Freq: Every day | ORAL | Status: DC
  Administered 2022-05-23: 81 mg via ORAL

## 2022-05-23 MED ORDER — CLOPIDOGREL BISULFATE 75 MG PO TABS: 75.0000 mg | ORAL_TABLET | Freq: Every day | ORAL | Status: DC

## 2022-05-23 MED ORDER — CLOPIDOGREL BISULFATE 75 MG PO TABS
300.0000 mg | ORAL_TABLET | Freq: Once | ORAL | Status: AC
  Administered 2022-05-23: 300 mg via ORAL

## 2022-05-23 NOTE — Care Management CC44 (Signed)
Condition Code 44 Documentation Completed  Patient Details  Name: Tamara Andrews MRN: [id number] Date of Birth: 08/19/1951   Condition Code 44 given:  Yes Patient signature on Condition Code 44 notice:  Yes Documentation of 2 MD's agreement:  Yes Code 44 added to claim:  Yes    Bess Kinds, RN 05/23/2022, 3:28 PM

## 2022-05-23 NOTE — Discharge Summary (Signed)
Physician Discharge Summary   Patient: Tamara Andrews MRN: [id number] DOB: 10-31-1951  Admit date:     05/22/2022  Discharge date: 05/23/22  Discharge Physician: Edwin Dada   PCP: Fanny Bien, MD     Recommendations at discharge:  Follow up with Neurology in 6-8 weeks for new stroke Follow up with Dr. Ernie Hew in 1 week Dr. Ernie Hew: Please follow up SPEP and serum free light chains obtained in hospital due to bone finding on MRI brain (See below); if residual doubt after SPEP, please refer to Oncology for evaluation Dr. Ernie Hew: Please check lipids in 3 months on new Atorvastatin Dr. Ernie Hew: Please refer for thyroid US due to incidentally noted finding on neck imaging Follow up with Vascular Surgery Dr. Virl Cagey for venous insufficiency Follow up with Cardiology for Zio patch cardiac event monitor     Discharge Diagnoses: Principal Problem:   Stroke [facility]) Active Problems:   Essential hypertension   Thyroid nodule   Abnormal bone radiograph   Venous insufficiency     Hospital Course: Tamara Andrews is a 71 y.o. F with HTN who presented with right arm weakness.  This resolved by the time of arrival to the ER, but MRI brain obtained at the hospital demonstratd a small right juxtacortical infarct in the motor cortex.     Acute possibly embolic stroke MRI brain showed new acute infarct, suspet small embolus.  Non-invasive angiography showed no significant carotid disease or intracranial disease.  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 --> follow up LE dopplers ruled out DVT  Lipids ordered: discharged on new atorvastatin 10 nightly  Aspirin ordered at admission --> discharged on DAPT for 3 weeks then aspirin alone indefinitely  Atrial fibrillation: Zio patch recmomended, this was ordered and will be mailed to patient  tPA not given because symptoms rsolved by presenation  Dysphagia screen ordered in ER  PT eval  ordered: recommended no PT follow up  Smoking cessation: nonsmoker      Abnormal bone radiograph This was an incidental finding on her CTA of the head and neck that was desribed as "Indeterminate heterogeneous bone mineralization. Recommend correlation with SPEP/UPEP".  SPEP and FLC sent in the hospital.  - Recommend close PCP follow up.    Thyroid nodule Incidental finding - Recommend US thyroid as an outpatient     Abnormal venous ultrasound, suspect venous insufficiency Due to interatrial shunt observed on echo, LE dopplers were obtained.  These showed dilated veins and sluggish flow on the contralateral side to her typical "intermittent leg swelling".  No clot was seen.  This was discussed with Vascular surgery who will follow up in the office for what is likely simply venous insufficiency.            The [facility] Controlled Substances Registry was reviewed for this patient prior to discharge.   Consultants: Neurology Procedures performed: MRI brain, Echo, CTA head and neck, LE doppler  Disposition: Home Diet recommendation:  Discharge Diet Orders (From admission, onward)     Start     Ordered   05/23/22 0000  Diet - low sodium heart healthy        05/23/22 1422             DISCHARGE MEDICATION: Allergies as of 05/23/2022       Reactions   Lactose Intolerance (gi) Diarrhea        Medication List     TAKE these medications  aspirin 81 MG chewable tablet Chew 1 tablet (81 mg total) by mouth daily. Start taking on: May 24, 2022   atorvastatin 10 MG tablet Commonly known as: Lipitor Take 1 tablet (10 mg total) by mouth at bedtime.   clopidogrel 75 MG tablet Commonly known as: PLAVIX Take 1 tablet (75 mg total) by mouth daily. Start taking on: May 24, 2022   famotidine 40 MG tablet Commonly known as: PEPCID Take 40 mg by mouth daily.   sertraline 50 MG tablet Commonly known as: ZOLOFT Take 50 mg by mouth every morning.    valsartan 160 MG tablet Commonly known as: DIOVAN Take 240 mg by mouth daily. 1.5 tablets   VITAMIN D PO Take 1 tablet by mouth daily.        Follow-up Information     Fanny Bien, MD. Schedule an appointment as soon as possible for a visit in 1 week(s).   Specialty: Family Medicine Contact information: [address] [place] [address] [phone]         [facility]. Go in 2 month(s).   Specialty: Neurology Contact information: [address] [place] [phone]                Discharge Instructions     Ambulatory referral to Neurology   Complete by: As directed    An appointment is requested in approximately: 4 weeks For stroke follow up   Diet - low sodium heart healthy   Complete by: As directed    Discharge instructions   Complete by: As directed    From Dr. Loleta Books: You were admitted for a small stroke in the left motor cortex (which corresponds to the right hand) Here, this stroke was observed on your MRI of the brain You also had an ultrasound of the heart which showed normal heart function and normal heart valves It did show a possible "interatrial shunt" which is a common finding (usually a "patent foramen ovale"), but allows blood to flow from one side of the heart to the other For that reason, we wanted to check your legs with an ultrasound for blood clots There were no blood clots, but we did see sluggish flow, consistent with "venous insufficiency" which is not particularly dangerous, but should be followed up with a vascular surgeon   To reduce the risk of stroke: Take aspirin 81 mg daily from now on  For the next three weeks, take clopidogrel/Plavix 75 mg daily (this is a "super aspirin" and thins the blood slightly)  Start the cholesterol medicine atorvastatin 10 mg nightly  Continue your blood pressure medicine valsartan/Diovan  Go see Dr. Ernie Hew in 1 week I have  arranged a follow up with the Neurology specialists (one of Dr. Cecil Cobbs partners) for follow up of the stroke at [facility] in 2-3 months, they will contact you for an appointment   Increase activity slowly   Complete by: As directed        Discharge Exam: Filed Weights   05/22/22 0020  Weight: 51.3 kg    General: Pt is alert, awake, not in acute distress Cardiovascular: RRR, nl S1-S2, no murmurs appreciated.   No LE edema.   Respiratory: Normal respiratory rate and rhythm.  CTAB without rales or wheezes. Abdominal: Abdomen soft and non-tender.  No distension or HSM.   Neuro/Psych: Strength symmetric in upper and lower extremities.  Judgment and insight appear normal.   Condition at discharge: good  The  results of significant diagnostics from this hospitalization (including imaging, microbiology, ancillary and laboratory) are listed below for reference.   Imaging Studies: CT ANGIO HEAD W OR WO CONTRAST  Addendum Date: 05/22/2022   ADDENDUM REPORT: 05/22/2022 05:23 ADDENDUM: There should also be additional impressions reading: 4. Indeterminate heterogeneous bone mineralization. Recommend correlation with SPEP/UPEP to exclude the possibility of Multiple Myeloma. 5. Left thyroid 16 mm nodule. Recommend thyroid US (ref: J Am Coll Radiol. 2015 Feb;12(2): 143-50). Electronically Signed   By: Genevie Ann M.D.   On: 05/22/2022 05:23   Result Date: 05/22/2022 CLINICAL DATA:  71 year old female with dizziness and right extremity weakness. 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 EXAM: CT ANGIOGRAPHY HEAD AND NECK TECHNIQUE: Multidetector CT imaging of the head and neck was performed using the standard protocol during bolus administration of intravenous contrast. Multiplanar CT image reconstructions and MIPs were obtained to evaluate the vascular anatomy. Carotid stenosis measurements (when applicable) are obtained utilizing NASCET criteria, using the distal internal carotid  diameter as the denominator. RADIATION DOSE REDUCTION: This exam was performed according to the departmental dose-optimization program which includes automated exposure control, adjustment of the mA and/or kV according to patient size and/or use of iterative reconstruction technique. CONTRAST:  59m OMNIPAQUE IOHEXOL 350 MG/ML SOLN COMPARISON:  Plain head CT 0135 hours. FINDINGS: CTA NECK Skeleton: Diffusely heterogeneous bone mineralization. Granular appearance of the spinal vertebrae, ribs. But no overt lytic osseous lesion identified. Upper chest: Negative; no superior mediastinal lymphadenopathy. Other neck: Hyperdense or hyperenhancing 16 mm left thyroid nodule on series 4, image 131. Otherwise within normal limits. Aortic arch: 3 vessel arch configuration with mild arch tortuosity. Minimal arch atherosclerosis. Right carotid system: Proximal right CCA partially obscured by dense right subclavian venous contrast streak artifact, but appears normal. Negative right carotid bifurcation. Mildly tortuous cervical right ICA. Left carotid system: Negative left CCA. Calcified plaque at the posterior left ICA origin with no significant stenosis. Vertebral arteries: No normal origin of the right vertebral artery. But no occluded origin is evident. Little to no proximal right subclavian plaque. Highly diminutive right vertebral V2 segment appears reconstituted from thyrocervical and muscular branches, and right cervical transverse foramen is diminutive on series 6, image 201. Thread-like right vertebral artery functionally terminates outside the skull. Normal proximal left subclavian artery and left vertebral artery origin. Dominant but fairly normal caliber left vertebral artery is patent to the skull base with some tortuosity but no plaque or stenosis. CTA HEAD Posterior circulation: Fairly normal caliber left vertebral artery supplies the basilar. Normal left PICA origin. Patent right AICA appears to supply the right  PICA. Patent basilar artery, but moderate irregularity and stenosis of the mid basilar as seen on series 10, image 22. Superimposed fetal type bilateral PCA origins. Basilar tip and SCA origins remain patent. Bilateral PCA branches are within normal limits. Anterior circulation: Both ICA siphons are patent. There is relatively mild bilateral siphon calcified plaque and no significant siphon stenosis. Both posterior communicating artery origins are normal. Patent carotid termini, MCA and ACA origins. Diminutive or absent anterior communicating artery. Bilateral ACA branches are within normal limits. Left MCA M1 segment and trifurcation are patent without stenosis. Right MCA M1 segment and trifurcation are patent without stenosis. Bilateral MCA branches are within normal limits. Venous sinuses: Early contrast timing, grossly patent superior sagittal sinus and transverse sinuses. Anatomic variants: Dominant appearing left vertebral artery. Review of the MIP images confirms the above findings IMPRESSION: 1. Abnormal Posterior Circulation, with Absent intracranial Right Vertebral  Artery and Moderately irregular and stenotic Basilar artery. 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 But given right cerebellar infarcts on earlier Head CT (age indeterminate), a Right Vertebral Artery Occlusion (also age indeterminate) is not excluded. Follow-up Brain MRI (noncontrast should suffice) recommended to try to clarify. 2. The left vertebral is patent without stenosis and supplies the Basilar, and there are Fetal Type Bilateral PCA origins. 3. Mild for age Carotid and Anterior circulation plaque with no stenosis. Electronically Signed: By: Genevie Ann M.D. On: 05/22/2022 05:19   CT HEAD WO CONTRAST  Result Date: 05/22/2022 CLINICAL DATA:  Dizziness with right extremity weakness. EXAM: CT HEAD WITHOUT CONTRAST TECHNIQUE:  Contiguous axial images were obtained from the base of the skull through the vertex without intravenous contrast. RADIATION DOSE REDUCTION: This exam was performed according to the departmental dose-optimization program which includes automated exposure control, adjustment of the mA and/or kV according to patient size and/or use of iterative reconstruction technique. COMPARISON:  None Available. FINDINGS: Brain: There is mild cerebral atrophy with widening of the extra-axial spaces and ventricular dilatation. There are areas of decreased attenuation within the white matter tracts of the supratentorial brain, consistent with microvascular disease changes. 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 Vascular: No hyperdense vessel or unexpected calcification. Skull: Normal. Negative for fracture or focal lesion. Sinuses/Orbits: No acute finding. Other: None. IMPRESSION: 1. Generalized cerebral atrophy and chronic white matter small vessel ischemic changes without evidence of an acute intracranial abnormality. 2. 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 MRI correlation is recommended. Electronically Signed   By: Virgina Norfolk M.D.   On: 05/22/2022 01:52   CT ANGIO NECK W OR WO CONTRAST  Addendum Date: 05/22/2022   ADDENDUM REPORT: 05/22/2022 05:23 ADDENDUM: There should also be additional impressions reading: 4. Indeterminate heterogeneous bone mineralization. Recommend correlation with SPEP/UPEP to exclude the possibility of Multiple Myeloma. 5. Left thyroid 16 mm nodule. Recommend thyroid US (ref: J Am Coll Radiol. 2015 Feb;12(2): 143-50). Electronically Signed   By: Genevie Ann M.D.   On: 05/22/2022 05:23   Result Date: 05/22/2022 CLINICAL DATA:  71 year old female with dizziness and right extremity weakness. 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 EXAM: CT ANGIOGRAPHY HEAD AND  NECK TECHNIQUE: Multidetector CT imaging of the head and neck was performed using the standard protocol during bolus administration of intravenous contrast. Multiplanar CT image reconstructions and MIPs were obtained to evaluate the vascular anatomy. Carotid stenosis measurements (when applicable) are obtained utilizing NASCET criteria, using the distal internal carotid diameter as the denominator. RADIATION DOSE REDUCTION: This exam was performed according to the departmental dose-optimization program which includes automated exposure control, adjustment of the mA and/or kV according to patient size and/or use of iterative reconstruction technique. CONTRAST:  33m OMNIPAQUE IOHEXOL 350 MG/ML SOLN COMPARISON:  Plain head CT 0135 hours. FINDINGS: CTA NECK Skeleton: Diffusely heterogeneous bone mineralization. Granular appearance of the spinal vertebrae, ribs. But no overt lytic osseous lesion identified. Upper chest: Negative; no superior mediastinal lymphadenopathy. Other neck: Hyperdense or hyperenhancing 16 mm left thyroid nodule on series 4, image 131. Otherwise within normal limits. Aortic arch: 3 vessel arch configuration with mild arch tortuosity. Minimal arch atherosclerosis. Right carotid system: Proximal right CCA partially obscured by dense right subclavian venous contrast streak artifact, but appears normal. Negative  right carotid bifurcation. Mildly tortuous cervical right ICA. Left carotid system: Negative left CCA. Calcified plaque at the posterior left ICA origin with no significant stenosis. Vertebral arteries: No normal origin of the right vertebral artery. But no occluded origin is evident. Little to no proximal right subclavian plaque. Highly diminutive right vertebral V2 segment appears reconstituted from thyrocervical and muscular branches, and right cervical transverse foramen is diminutive on series 6, image 201. Thread-like right vertebral artery functionally terminates outside the skull.  Normal proximal left subclavian artery and left vertebral artery origin. Dominant but fairly normal caliber left vertebral artery is patent to the skull base with some tortuosity but no plaque or stenosis. CTA HEAD Posterior circulation: Fairly normal caliber left vertebral artery supplies the basilar. Normal left PICA origin. Patent right AICA appears to supply the right PICA. Patent basilar artery, but moderate irregularity and stenosis of the mid basilar as seen on series 10, image 22. Superimposed fetal type bilateral PCA origins. Basilar tip and SCA origins remain patent. Bilateral PCA branches are within normal limits. Anterior circulation: Both ICA siphons are patent. There is relatively mild bilateral siphon calcified plaque and no significant siphon stenosis. Both posterior communicating artery origins are normal. Patent carotid termini, MCA and ACA origins. Diminutive or absent anterior communicating artery. Bilateral ACA branches are within normal limits. Left MCA M1 segment and trifurcation are patent without stenosis. Right MCA M1 segment and trifurcation are patent without stenosis. Bilateral MCA branches are within normal limits. Venous sinuses: Early contrast timing, grossly patent superior sagittal sinus and transverse sinuses. Anatomic variants: Dominant appearing left vertebral artery. Review of the MIP images confirms the above findings IMPRESSION: 1. Abnormal Posterior Circulation, with Absent intracranial Right Vertebral Artery and Moderately irregular and stenotic Basilar artery. 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 But given right cerebellar infarcts on earlier Head CT (age indeterminate), a Right Vertebral Artery Occlusion (also age indeterminate) is not excluded. Follow-up Brain MRI (noncontrast should suffice) recommended to try to clarify. 2. The left vertebral is patent  without stenosis and supplies the Basilar, and there are Fetal Type Bilateral PCA origins. 3. Mild for age Carotid and Anterior circulation plaque with no stenosis. Electronically Signed: By: Genevie Ann M.D. On: 05/22/2022 05:19   MR Brain Wo Contrast (neuro protocol)  Result Date: 05/22/2022 CLINICAL DATA:  71 year old female with dizziness and right extremity weakness. Cerebellar infarcts on CT and abnormal right vertebral artery and basilar artery on CTA today. EXAM: MRI HEAD WITHOUT CONTRAST TECHNIQUE: Multiplanar, multiecho pulse sequences of the brain and surrounding structures were obtained without intravenous contrast. COMPARISON:  CTA head and neck, plain head CT earlier today. FINDINGS: Brain: Multiple right greater than left patchy and small cerebellar infarcts are chronic. And there is some chronic Wallerian degeneration suspected in the right cerebellar peduncle on series 8, image 7. Subtle curvilinear area of cortical or subcortical white matter restricted diffusion at the left superior perirolandic cortex (near right upper extremity representation area) only evident on axial DWI series 5, image 85). Faint if any acute T2 and FLAIR hyperintensity. Some chronic T2 and FLAIR signal changes in the white matter here with mild T2 shine through. No hemorrhage or mass effect. No other convincing restricted diffusion. No midline shift, mass effect, evidence of mass lesion, ventriculomegaly, extra-axial collection or acute intracranial hemorrhage. Cervicomedullary junction and pituitary are within normal limits. Patchy bilateral mostly periventricular additional white  matter T2 and FLAIR hyperintensity is moderate for age. No supratentorial cortical encephalomalacia, but there is evidence of small chronic micro hemorrhages in the right frontal horn white matter. Deep gray matter nuclei and brainstem within normal limits. Vascular: Major intracranial vascular flow voids dominant, tortuous distal left vertebral  artery redemonstrated with no significant right vertebral V4 segment evident, suggesting congenital hypoplasia or remote occlusion. Other Major intracranial vascular flow voids are preserved. Skull and upper cervical spine: Mildly heterogeneous marrow signal as seen by CTA, but no destructive osseous lesion identified. Otherwise negative for age visible cervical spine. Sinuses/Orbits: Postoperative changes to both globes. Paranasal sinuses and mastoids are stable and well aerated. Other: Grossly normal visible internal auditory structures. IMPRESSION: 1. Subtle acute cortical/subcortical infarct in the Left Superior Perirolandic Cortex (near right upper extremity representation area). Underlying moderate chronic cerebral white matter changes. No associated hemorrhage or mass effect. 2. No other acute intracranial abnormality. 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 Electronically Signed   By: Genevie Ann M.D.   On: 05/22/2022 07:59   ECHOCARDIOGRAM COMPLETE BUBBLE STUDY  Result Date: 05/22/2022    ECHOCARDIOGRAM REPORT   Patient Name:   Tamara Andrews Date of Exam: 05/22/2022 Medical Rec #:  [id number]       Height:       62.0 in Accession #:    [id number]      Weight:       113.0 lb Date of Birth:  June 17, 1951       BSA:          1.500 m Patient Age:    26 years        BP:           129/73 mmHg Patient Gender: F               HR:           49 bpm. Exam Location:  Inpatient Procedure: 2D Echo, Cardiac Doppler, Color Doppler and Saline Contrast Bubble            Study Indications:    TIA  History:        Patient has no prior history of Echocardiogram examinations.                 Risk Factors:Hypertension.  Sonographer:    Jyl Heinz Referring Phys: [id number] Elsa  1. Left ventricular ejection fraction, by estimation, is 60 to 65%. The left ventricle has normal  function. The left ventricle has no regional wall motion abnormalities. There is mild left ventricular hypertrophy. Left ventricular diastolic parameters are indeterminate.  2. Right ventricular systolic function is normal. The right ventricular size is normal. There is normal pulmonary artery systolic pressure. The estimated right ventricular systolic pressure is 73.2 mmHg.  3. The mitral valve is normal in structure. Trivial mitral valve regurgitation. No evidence of mitral stenosis.  4. The aortic valve is tricuspid. Aortic valve regurgitation is not visualized. No aortic stenosis is present.  5. The inferior vena cava is normal in size with greater than 50% respiratory variability, suggesting right atrial pressure of 3 mmHg.  6. Agitated saline contrast bubble study was positive with shunting observed within 3-6 cardiac cycles suggestive of interatrial shunt. Technically difficult bubble study, but appears to be small amount of shunting after ~5 cardiac cycles FINDINGS  Left Ventricle: Left ventricular ejection fraction, by estimation,  is 60 to 65%. The left ventricle has normal function. The left ventricle has no regional wall motion abnormalities. The left ventricular internal cavity size was normal in size. There is  mild left ventricular hypertrophy. Left ventricular diastolic parameters are indeterminate. Right Ventricle: The right ventricular size is normal. No increase in right ventricular wall thickness. Right ventricular systolic function is normal. There is normal pulmonary artery systolic pressure. The tricuspid regurgitant velocity is 2.41 m/s, and  with an assumed right atrial pressure of 3 mmHg, the estimated right ventricular systolic pressure is 55.7 mmHg. Left Atrium: Left atrial size was normal in size. Right Atrium: Right atrial size was normal in size. Pericardium: Trivial pericardial effusion is present. Mitral Valve: The mitral valve is normal in structure. Trivial mitral valve  regurgitation. No evidence of mitral valve stenosis. Tricuspid Valve: The tricuspid valve is normal in structure. Tricuspid valve regurgitation is trivial. Aortic Valve: The aortic valve is tricuspid. Aortic valve regurgitation is not visualized. No aortic stenosis is present. Aortic valve peak gradient measures 9.2 mmHg. Pulmonic Valve: The pulmonic valve was normal in structure. Pulmonic valve regurgitation is trivial. Aorta: The aortic root and ascending aorta are structurally normal, with no evidence of dilitation. Venous: The inferior vena cava is normal in size with greater than 50% respiratory variability, suggesting right atrial pressure of 3 mmHg. IAS/Shunts: The interatrial septum was not well visualized. Agitated saline contrast was given intravenously to evaluate for intracardiac shunting. Agitated saline contrast bubble study was positive with shunting observed within 3-6 cardiac cycles suggestive of interatrial shunt.  LEFT VENTRICLE PLAX 2D LVIDd:         4.10 cm     Diastology LVIDs:         2.70 cm     LV e' medial:    5.00 cm/s LV PW:         1.10 cm     LV E/e' medial:  17.4 LV IVS:        0.90 cm     LV e' lateral:   5.55 cm/s LVOT diam:     1.80 cm     LV E/e' lateral: 15.7 LV SV:         68 LV SV Index:   45 LVOT Area:     2.54 cm  LV Volumes (MOD) LV vol d, MOD A2C: 60.1 ml LV vol d, MOD A4C: 57.8 ml LV vol s, MOD A2C: 19.7 ml LV vol s, MOD A4C: 21.7 ml LV SV MOD A2C:     40.4 ml LV SV MOD A4C:     57.8 ml LV SV MOD BP:      39.4 ml RIGHT VENTRICLE             IVC RV Basal diam:  3.10 cm     IVC diam: 1.20 cm RV Mid diam:    2.70 cm RV S prime:     10.10 cm/s TAPSE (M-mode): 2.0 cm LEFT ATRIUM             Index        RIGHT ATRIUM           Index LA diam:        2.90 cm 1.93 cm/m   RA Area:     13.30 cm LA Vol (A2C):   32.8 ml 21.87 ml/m  RA Volume:   32.00 ml  21.34 ml/m LA Vol (A4C):   42.6 ml 28.40 ml/m LA Biplane Vol: 38.1 ml  25.40 ml/m  AORTIC VALVE AV Area (Vmax): 1.93 cm AV  Vmax:        152.00 cm/s AV Peak Grad:   9.2 mmHg LVOT Vmax:      115.00 cm/s LVOT Vmean:     79.400 cm/s LVOT VTI:       0.267 m  AORTA Ao Root diam: 2.80 cm Ao Asc diam:  2.70 cm MITRAL VALVE               TRICUSPID VALVE MV Area (PHT): 4.08 cm    TR Peak grad:   23.2 mmHg MV Decel Time: 186 msec    TR Vmax:        241.00 cm/s MV E velocity: 87.00 cm/s MV A velocity: 98.50 cm/s  SHUNTS MV E/A ratio:  0.88        Systemic VTI:  0.27 m                            Systemic Diam: 1.80 cm Oswaldo Milian MD Electronically signed by Oswaldo Milian MD Signature Date/Time: 05/22/2022/2:42:23 PM    Final    VAS [place] LOWER EXTREMITY VENOUS (DVT)  Result Date: 05/23/2022  Lower Venous DVT Study Patient Name:  Tamara Andrews  Date of Exam:   05/23/2022 Medical Rec #: [id number]        Accession #:    [id number] Date of Birth: 08/05/1951        Patient Gender: F Patient Age:   53 years Exam Location:  [facility] Procedure:      VAS [place] LOWER EXTREMITY VENOUS (DVT) Referring Phys: MCNEILL KIRKPATRICK --------------------------------------------------------------------------------  Indications: Swelling, and stroke.  Comparison Study: No prior study Performing Technologist: Sharion Dove RVS  Examination Guidelines: A complete evaluation includes B-mode imaging, spectral Doppler, color Doppler, and power Doppler as needed of all accessible portions of each vessel. Bilateral testing is considered an integral part of a complete examination. Limited examinations for reoccurring indications may be performed as noted. The reflux portion of the exam is performed with the patient in reverse Trendelenburg.  +---------+---------------+---------+-----------+----------+--------------+ RIGHT    CompressibilityPhasicitySpontaneityPropertiesThrombus Aging +---------+---------------+---------+-----------+----------+--------------+ CFV      Full           Yes      Yes                                  +---------+---------------+---------+-----------+----------+--------------+ SFJ      Full                                                        +---------+---------------+---------+-----------+----------+--------------+ FV Prox  Full                                                        +---------+---------------+---------+-----------+----------+--------------+ FV Mid   Full                                                        +---------+---------------+---------+-----------+----------+--------------+  FV DistalFull                                                        +---------+---------------+---------+-----------+----------+--------------+ PFV      Full                                                        +---------+---------------+---------+-----------+----------+--------------+ POP      Full           Yes      Yes                                 +---------+---------------+---------+-----------+----------+--------------+ PTV      Full                                                        +---------+---------------+---------+-----------+----------+--------------+ PERO     Full                                                        +---------+---------------+---------+-----------+----------+--------------+ Gastroc  Full                                                        +---------+---------------+---------+-----------+----------+--------------+ The popliteal, posterior tibial, and gastrocnemius veins are dilated with rouleaux flow noted; however, all veins are easily compressible.  +---------+---------------+---------+-----------+----------+--------------+ LEFT     CompressibilityPhasicitySpontaneityPropertiesThrombus Aging +---------+---------------+---------+-----------+----------+--------------+ CFV      Full           Yes      Yes                                  +---------+---------------+---------+-----------+----------+--------------+ SFJ      Full                                                        +---------+---------------+---------+-----------+----------+--------------+ FV Prox  Full                                                        +---------+---------------+---------+-----------+----------+--------------+ FV Mid   Full                                                        +---------+---------------+---------+-----------+----------+--------------+  FV DistalFull                                                        +---------+---------------+---------+-----------+----------+--------------+ PFV      Full                                                        +---------+---------------+---------+-----------+----------+--------------+ POP      Full                                                        +---------+---------------+---------+-----------+----------+--------------+ PTV      Full                                                        +---------+---------------+---------+-----------+----------+--------------+ PERO     Full                                                        +---------+---------------+---------+-----------+----------+--------------+ Soleal   Full                                                        +---------+---------------+---------+-----------+----------+--------------+ Gastroc  Full                                                        +---------+---------------+---------+-----------+----------+--------------+    Summary: BILATERAL: - No evidence of deep vein thrombosis seen in the lower extremities, bilaterally. -No evidence of popliteal cyst, bilaterally. RIGHT: - The popliteal, posterior tibial, and gastrocnemius veins are dilated with rouleaux flow noted; however, all veins are easily compressible.   *See table(s) above for measurements and  observations.    Preliminary     Microbiology: No results found for this or any previous visit.  Labs: CBC: Recent Labs  Lab 05/22/22 0123 05/22/22 0623 05/23/22 0503  WBC 4.6 4.4 3.7*  NEUTROABS 2.7 2.4  --   HGB 14.2 13.7 12.9  HCT 44.2 41.3 38.6  MCV 91.9 92.0 90.2  PLT 153 141* 846*   Basic Metabolic Panel: Recent Labs  Lab 05/22/22 0123 05/22/22 0448 05/23/22 0503  NA 144 142 140  K 3.7 3.8 4.3  CL 108 108 109  CO2 29 28 26   GLUCOSE 127* 86 81  BUN 34* 30* 13  CREATININE 0.79 0.73 0.80  CALCIUM 10.3 9.6  9.3  MG  --  2.1  --    Liver Function Tests: Recent Labs  Lab 05/22/22 0123 05/22/22 0448 05/23/22 0503  AST 30 31 22   ALT 42 37 27  ALKPHOS 62 58 51  BILITOT 0.6 0.8 0.6  PROT 8.2* 7.3 6.5  ALBUMIN 4.1 3.7 3.0*   CBG: No results for input(s): GLUCAP in the last 168 hours.  Discharge time spent: approximately 40 minutes spent on discharge counseling, evaluation of patient on day of discharge, and coordination of discharge planning with nursing, social work, pharmacy and case management  Signed: Edwin Dada, MD [facility] 05/23/2022

## 2022-05-23 NOTE — Consult Note (Signed)
Neurology Consultation Reason for Consult: Stroke Referring Physician: Johnna Acosta  CC: Arm weakness  History is obtained from: Patient  HPI: Tamara Andrews is a 71 y.o. female who presents with acute onset right arm weakness.  She was making lunch, when she suddenly noticed that her right hand was dropping things that she tried to use them.  She repeatedly tested this, and kept finding that she was dropping things and for this reason sought care in the emergency department at [facility].  In the emergency department, she had an MRI which does demonstrate a small juxtacortical infarct, though her symptoms have improved and she feels back to baseline at this point.  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  Following this, she had both occipital head pain, neck pain, as well as severe vertigo.  She needed physical therapy to recover, who also attempted the Epley multiple times.    Also of note, she does note that she has swelling of her left leg from time to time.  LKW: 11 AM 6/3 tpa given?: no, out of window    Past Medical History:  Diagnosis Date   Asthma    Essential hypertension    Vertigo      History reviewed. No pertinent family history.   Social History:  reports that she has never smoked. She has never used smokeless tobacco. She reports that she does not currently use alcohol. She reports that she does not use drugs.   Exam: Current vital signs: BP (!) 154/80 (BP Location: Left Arm)   Pulse (!) 53   Temp 97.7 F (36.5 C) (Oral)   Resp 16   Ht 5\' 2"  (1.575 m)   Wt 51.3 kg   SpO2 98%   BMI 20.67 kg/m  Vital signs in last 24 hours: Temp:  [97.5 F (36.4 C)-98 F (36.7 C)] 97.7 F (36.5 C) (06/05 0814) Pulse Rate:  [45-100] 53 (06/05 0814) Resp:  [13-20] 16 (06/05 0814) BP: (120-159)/(70-105) 154/80 (06/05 0814) SpO2:  [98 %-100 %] 98 % (06/05 0814)   Physical Exam  Constitutional: Appears well-developed and well-nourished.   Psych: Affect appropriate to situation Eyes: No scleral injection HENT: No OP obstruction MSK: no joint deformities.  No cords in her knees Cardiovascular: Normal rate and regular rhythm.  Respiratory: Effort normal, non-labored breathing GI: Soft.  No distension. There is no tenderness.  Skin: WDI  Neuro: Mental Status: Patient is awake, alert, oriented to person, place, month, year, and situation. Patient is able to give a clear and coherent history. No signs of aphasia or neglect Cranial Nerves: II: Visual Fields are full. Pupils are equal, round, and reactive to light.   III,IV, VI: EOMI without ptosis or diploplia.  V: Facial sensation is symmetric to temperature VII: Facial movement is symmetric.  VIII: hearing is intact to voice X: Uvula elevates symmetrically XI: Shoulder shrug is symmetric. XII: tongue is midline without atrophy or fasciculations.  Motor: Tone is normal. Bulk is normal. 5/5 strength was present in all four extremities.  She does have significantly slower rapid finger movements (such as sequential opposition) on her right than her left Sensory: Sensation is symmetric to light touch and temperature in the arms and legs. 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  Her symptoms are improving, and I wonder if she had a transient small branch occlusion with the injury seen representing sequela of this.  Small vessel disease I do not feel is excluded, but I would favor small embolus.  Her posterior circulation strokes do appear embolic, and with her recent recurrence of vertigo this could represent embolic phenomenon.  She does have a PFO, and  though I would not necessarily recommend referral for closure I do think it would be worthwhile to exclude DVT with lower extremity Doppler.  I also think it would be worthwhile to do a longer heart monitor, e.g. 30 day Zio patch or equivalent.  Her LDL is barely outside of range, I would favor starting statin, but do not feel that high intensity statin is necessarily needed.  Would start low-dose statin therapy.  Recommendations: 1) aspirin 81 mg and Plavix 75 mg following 300 mg load for 3 weeks followed by aspirin 81mg  daily monotherapy 2) Lipitor 10 mg nightly 3) lower extremity Dopplers 4) prolonged cardiac monitoring 5) she will need outpatient neurological follow-up   , MD [facility] [phone]  If 7pm- 7am, please page neurology on call as listed in AMION.

## 2022-05-23 NOTE — Assessment & Plan Note (Signed)
Incidental finding - Recommend US thyroid

## 2022-05-23 NOTE — Assessment & Plan Note (Signed)
-   Obtain SPEP, UFLC

## 2022-05-23 NOTE — TOC Initial Note (Addendum)
Transition of Care [facility]) - Initial/Assessment Note    Patient Details  Name: Tamara Andrews MRN: [id number] Date of Birth: Oct 15, 1951  [facility] CM/SW Contact:    Bess Kinds, RN Phone Number: [phone] 05/23/2022, 12:05 PM  Clinical Narrative:                  Spoke with patient at the bedside to discuss post acute transition. Demographics verified. PCP verified. Preferred pharmacy verified. Patient would like to use [facility] pharmacy for DC medications if possible.   Discussed insurance concerns broadly. Advised to call insurance company for more detailed information if needed.   Patient to call a friend to pick her up at discharge stating her friends were mad at her when they found out she called an Benedetto Goad to bring her to the hospital.   Patient stated that she may transition home today depending on her tests.   TOC following for transition needs.   Expected Discharge Plan: Home/Self Care Barriers to Discharge: Continued Medical Work up   Patient Goals and CMS Choice Patient states their goals for this hospitalization and ongoing recovery are:: return home CMS Medicare.gov Compare Post Acute Care list provided to:: Patient Choice offered to / list presented to : NA  Expected Discharge Plan and Services Expected Discharge Plan: Home/Self Care In-house Referral: Financial Counselor Discharge Planning Services: CM Consult Post Acute Care Choice: NA Living arrangements for the past 2 months: Single Family Home                 DME Arranged: N/A DME Agency: NA       HH Arranged: NA HH Agency: NA        Prior Living Arrangements/Services Living arrangements for the past 2 months: Single Family Home Lives with:: Self Patient language and need for interpreter reviewed:: Yes Do you feel safe going back to the place where you live?: Yes      Need for Family Participation in Patient Care: No (Comment)     Criminal Activity/Legal Involvement Pertinent to  Current Situation/Hospitalization: No - Comment as needed  Activities of Daily Living Home Assistive Devices/Equipment: None ADL Screening (condition at time of admission) Patient's cognitive ability adequate to safely complete daily activities?: Yes Is the patient deaf or have difficulty hearing?: No Does the patient have difficulty seeing, even when wearing glasses/contacts?: No Does the patient have difficulty concentrating, remembering, or making decisions?: No Patient able to express need for assistance with ADLs?: Yes Does the patient have difficulty dressing or bathing?: No Independently performs ADLs?: Yes (appropriate for developmental age) Does the patient have difficulty walking or climbing stairs?: No Weakness of Legs: None Weakness of Arms/Hands: None  Permission Sought/Granted                  Emotional Assessment Appearance:: Appears stated age Attitude/Demeanor/Rapport: Engaged Affect (typically observed): Accepting Orientation: : Oriented to Self, Oriented to Place, Oriented to  Time, Oriented to Situation Alcohol / Substance Use: Not Applicable Psych Involvement: No (comment)  Admission diagnosis:  Weakness of right upper extremity [R29.898] Stroke [facility]) [I63.9] Patient Active Problem List   Diagnosis Date Noted   Thyroid nodule 05/23/2022   Abnormal bone radiograph 05/23/2022   Stroke (HCC) 05/22/2022   Essential hypertension    PCP:  Lewis Moccasin, MD Pharmacy:   [facility] [address], [place] - [address] [place] [address] Phone: [phone] Fax: [phone]     Social Determinants of  Health (SDOH) Interventions    Readmission Risk Interventions     View : No data to display.

## 2022-05-23 NOTE — Progress Notes (Signed)
Discharge instructions given. Patient verbalized understanding and all questions were answered.  ?

## 2022-05-23 NOTE — Care Management Obs Status (Signed)
MEDICARE OBSERVATION STATUS NOTIFICATION   Patient Details  Name: Tamara Andrews MRN: [id number] Date of Birth: December 31, 1950   Medicare Observation Status Notification Given:  Yes    Bess Kinds, RN 05/23/2022, 3:25 PM

## 2022-05-23 NOTE — Progress Notes (Signed)
VASCULAR LAB    Bilateral lower extremity venous duplex has been performed.  See CV proc for preliminary results.   Blannie Shedlock, RVT 05/23/2022, 11:40 AM

## 2022-05-24 LAB — KAPPA/LAMBDA LIGHT CHAINS
Kappa free light chain: 40.7 mg/L — ABNORMAL HIGH (ref 3.3–19.4)
Kappa, lambda light chain ratio: 2.35 — ABNORMAL HIGH (ref 0.26–1.65)
Lambda free light chains: 17.3 mg/L (ref 5.7–26.3)

## 2022-05-25 LAB — PROTEIN ELECTROPHORESIS, SERUM
A/G Ratio: 1.1 (ref 0.7–1.7)
Albumin ELP: 3.8 g/dL (ref 2.9–4.4)
Alpha-1-Globulin: 0.1 g/dL (ref 0.0–0.4)
Alpha-2-Globulin: 0.5 g/dL (ref 0.4–1.0)
Beta Globulin: 1.1 g/dL (ref 0.7–1.3)
Gamma Globulin: 1.8 g/dL (ref 0.4–1.8)
Globulin, Total: 3.6 g/dL (ref 2.2–3.9)
Total Protein ELP: 7.4 g/dL (ref 6.0–8.5)

## 2022-05-26 ENCOUNTER — Other Ambulatory Visit (HOSPITAL_COMMUNITY): Payer: Self-pay

## 2022-05-27 ENCOUNTER — Other Ambulatory Visit: Payer: Self-pay | Admitting: Family Medicine

## 2022-05-27 ENCOUNTER — Other Ambulatory Visit (HOSPITAL_COMMUNITY): Payer: Self-pay

## 2022-05-27 DIAGNOSIS — R946 Abnormal results of thyroid function studies: Secondary | ICD-10-CM | POA: Diagnosis not present

## 2022-05-27 DIAGNOSIS — I639 Cerebral infarction, unspecified: Secondary | ICD-10-CM | POA: Diagnosis not present

## 2022-05-27 DIAGNOSIS — R9389 Abnormal findings on diagnostic imaging of other specified body structures: Secondary | ICD-10-CM

## 2022-05-27 DIAGNOSIS — R9089 Other abnormal findings on diagnostic imaging of central nervous system: Secondary | ICD-10-CM | POA: Diagnosis not present

## 2022-05-27 DIAGNOSIS — I998 Other disorder of circulatory system: Secondary | ICD-10-CM | POA: Diagnosis not present

## 2022-05-27 DIAGNOSIS — I10 Essential (primary) hypertension: Secondary | ICD-10-CM | POA: Diagnosis not present

## 2022-05-30 ENCOUNTER — Telehealth: Payer: Self-pay | Admitting: Hematology and Oncology

## 2022-05-30 ENCOUNTER — Ambulatory Visit
Admission: RE | Admit: 2022-05-30 | Discharge: 2022-05-30 | Disposition: A | Discharge status: Home / Self Care | Payer: Medicare PPO | Source: Ambulatory Visit | Attending: Family Medicine | Admitting: Family Medicine

## 2022-05-30 ENCOUNTER — Other Ambulatory Visit (HOSPITAL_COMMUNITY): Payer: Self-pay

## 2022-05-30 ENCOUNTER — Other Ambulatory Visit: Payer: Self-pay

## 2022-05-30 ENCOUNTER — Other Ambulatory Visit: Payer: Self-pay | Admitting: Family Medicine

## 2022-05-30 DIAGNOSIS — E041 Nontoxic single thyroid nodule: Secondary | ICD-10-CM | POA: Diagnosis not present

## 2022-05-30 DIAGNOSIS — R9389 Abnormal findings on diagnostic imaging of other specified body structures: Secondary | ICD-10-CM

## 2022-05-30 NOTE — Telephone Encounter (Signed)
Scheduled appt per 6/12 referral. Pt is aware of appt date and time. Pt is aware to arrive 15 mins prior to appt time and to bring and updated insurance card. Pt is aware of appt location.   

## 2022-06-01 ENCOUNTER — Other Ambulatory Visit: Payer: Self-pay | Admitting: Physician Assistant

## 2022-06-01 ENCOUNTER — Telehealth: Payer: Self-pay | Admitting: Physician Assistant

## 2022-06-01 DIAGNOSIS — R42 Dizziness and giddiness: Secondary | ICD-10-CM

## 2022-06-01 DIAGNOSIS — I639 Cerebral infarction, unspecified: Secondary | ICD-10-CM

## 2022-06-01 DIAGNOSIS — I4891 Unspecified atrial fibrillation: Secondary | ICD-10-CM

## 2022-06-01 NOTE — Telephone Encounter (Signed)
   I am covering [employer] today. Received secure chat from scheduler Ann Held with our office that patient contacted our office regarding heart monitor that the hospital was wanting her to have. Ann Held talked to Andee Lineman with monitor team who indicated order would need to be placed and to reach out to [employer]. Chart reviewed, IM note indicates Zio monitor was requested but I do not see any messages or orders from our team. Neurology note suggests prolonged cardiac monitoring (e.g. 30 days) so will plan standard 30 day monitor. Will place order under today's DOD per request and also request the patient have post-monitor follow-up arranged per usual protocol. Morrie Sheldon will help arrange the follow-up and will notify the patient of appt and that monitor will be mailed to her house.  Zamara Cozad PA-C

## 2022-06-01 NOTE — Telephone Encounter (Signed)
Enrolled patient for a 30 day [employer] event monitor to be mailed to patients home  

## 2022-06-07 ENCOUNTER — Other Ambulatory Visit: Payer: Self-pay | Admitting: Family Medicine

## 2022-06-07 ENCOUNTER — Other Ambulatory Visit: Payer: Self-pay

## 2022-06-07 DIAGNOSIS — E041 Nontoxic single thyroid nodule: Secondary | ICD-10-CM

## 2022-06-07 NOTE — Patient Outreach (Signed)
[facility]) Care Management  06/07/2022  Tamara Andrews [birth year] [id number]    EMMI-STROKE RED ON EMMI ALERT Day # 13 Date: 06/06/2022 Red Alert Reason: "Questions/problems with meds? Yes  went to follow-up appt? No Scheduled a follow up appt? No"   Outreach attempt # 1 to patient. Spoke with patient who reports she is doing well. Denies any sxs from stroke. She is managing at home. Reviewed and addressed red alerts. Patient did not make PCP follow up appt as she already had one scheduled prior to hospitalization in July. Reviewed discharge paperwork instructions with her and advised her that orders were to see PCP within one week. Explained importance of making appt with PCP ASAP. She voiced understanding. Patient states she called MD office yesterday regarding Plavix as she will need refill on med soon. She is awaiting return call from office. She confirm,s she has all other med. Patient has completed post discharge EMMI-Stroke automated calls. Denies need for follow up calls.    Plan: RN CM will close referral.  Antionette Fairy, RN,BSN,CCM [facility] Care Management Telephonic Care Management Coordinator Direct Phone: [phone] Toll Free: [phone] Fax: [phone]

## 2022-06-07 NOTE — Patient Outreach (Signed)
Received a red flag Emmi stroke notification for Tamara Andrews . I have assigned Antionette Fairy, RN to call for follow up and determine if there are any Case Management needs.    Iverson Alamin, Donivan Scull [facility] Care Management Assistant [facility] Care Management [phone]

## 2022-06-08 DIAGNOSIS — I639 Cerebral infarction, unspecified: Secondary | ICD-10-CM | POA: Diagnosis not present

## 2022-06-08 DIAGNOSIS — F411 Generalized anxiety disorder: Secondary | ICD-10-CM | POA: Diagnosis not present

## 2022-06-08 DIAGNOSIS — F331 Major depressive disorder, recurrent, moderate: Secondary | ICD-10-CM | POA: Diagnosis not present

## 2022-06-08 DIAGNOSIS — E041 Nontoxic single thyroid nodule: Secondary | ICD-10-CM | POA: Diagnosis not present

## 2022-06-08 DIAGNOSIS — I10 Essential (primary) hypertension: Secondary | ICD-10-CM | POA: Diagnosis not present

## 2022-06-08 DIAGNOSIS — G4700 Insomnia, unspecified: Secondary | ICD-10-CM | POA: Diagnosis not present

## 2022-06-09 ENCOUNTER — Other Ambulatory Visit (HOSPITAL_COMMUNITY)
Admission: RE | Admit: 2022-06-09 | Discharge: 2022-06-09 | Disposition: A | Discharge status: Home / Self Care | Payer: Medicare PPO | Source: Ambulatory Visit | Attending: Radiology | Admitting: Radiology

## 2022-06-09 ENCOUNTER — Ambulatory Visit
Admission: RE | Admit: 2022-06-09 | Discharge: 2022-06-09 | Disposition: A | Discharge status: Home / Self Care | Payer: Medicare PPO | Source: Ambulatory Visit | Attending: Family Medicine | Admitting: Family Medicine

## 2022-06-09 DIAGNOSIS — E041 Nontoxic single thyroid nodule: Secondary | ICD-10-CM | POA: Insufficient documentation

## 2022-06-10 ENCOUNTER — Telehealth (HOSPITAL_COMMUNITY): Payer: Self-pay

## 2022-06-10 LAB — CYTOLOGY - NON PAP

## 2022-06-11 ENCOUNTER — Ambulatory Visit (INDEPENDENT_AMBULATORY_CARE_PROVIDER_SITE_OTHER): Payer: Medicare PPO

## 2022-06-11 DIAGNOSIS — I4891 Unspecified atrial fibrillation: Secondary | ICD-10-CM | POA: Diagnosis not present

## 2022-06-11 DIAGNOSIS — I639 Cerebral infarction, unspecified: Secondary | ICD-10-CM | POA: Diagnosis not present

## 2022-06-11 DIAGNOSIS — R42 Dizziness and giddiness: Secondary | ICD-10-CM | POA: Diagnosis not present

## 2022-06-12 ENCOUNTER — Other Ambulatory Visit: Payer: Self-pay

## 2022-06-12 DIAGNOSIS — I872 Venous insufficiency (chronic) (peripheral): Secondary | ICD-10-CM

## 2022-06-14 ENCOUNTER — Other Ambulatory Visit (HOSPITAL_COMMUNITY): Payer: Self-pay

## 2022-06-14 ENCOUNTER — Telehealth (HOSPITAL_COMMUNITY): Payer: Self-pay

## 2022-06-16 ENCOUNTER — Inpatient Hospital Stay: Payer: Medicare PPO | Attending: Hematology and Oncology | Admitting: Hematology and Oncology

## 2022-06-16 ENCOUNTER — Encounter: Payer: Self-pay | Admitting: Hematology and Oncology

## 2022-06-16 ENCOUNTER — Other Ambulatory Visit: Payer: Self-pay

## 2022-06-16 DIAGNOSIS — I10 Essential (primary) hypertension: Secondary | ICD-10-CM | POA: Diagnosis not present

## 2022-06-16 DIAGNOSIS — R937 Abnormal findings on diagnostic imaging of other parts of musculoskeletal system: Secondary | ICD-10-CM | POA: Diagnosis not present

## 2022-06-16 DIAGNOSIS — I371 Nonrheumatic pulmonary valve insufficiency: Secondary | ICD-10-CM | POA: Diagnosis not present

## 2022-06-16 DIAGNOSIS — Z79899 Other long term (current) drug therapy: Secondary | ICD-10-CM

## 2022-06-16 DIAGNOSIS — Z7902 Long term (current) use of antithrombotics/antiplatelets: Secondary | ICD-10-CM | POA: Diagnosis not present

## 2022-06-16 DIAGNOSIS — E041 Nontoxic single thyroid nodule: Secondary | ICD-10-CM | POA: Diagnosis not present

## 2022-06-16 DIAGNOSIS — M810 Age-related osteoporosis without current pathological fracture: Secondary | ICD-10-CM | POA: Insufficient documentation

## 2022-06-16 DIAGNOSIS — I639 Cerebral infarction, unspecified: Secondary | ICD-10-CM | POA: Diagnosis not present

## 2022-06-16 DIAGNOSIS — R531 Weakness: Secondary | ICD-10-CM | POA: Diagnosis not present

## 2022-06-16 NOTE — Progress Notes (Signed)
[facility] CONSULT NOTE  Patient Care Team: Fanny Bien, MD as PCP - General (Family Medicine)  ASSESSMENT & PLAN:  Abnormal bone radiograph Likely related to osteoporosis No signs of anemia, hypercalcemia or renal failure SPEP is normal She does not need long term follow-up  Osteoporosis I recommend calcium, vitamin D and weight bearing exercises I suggest she review prolia treatment with her primary care doctor  Stroke [facility]) She has heart monitor in situ and has questions related to this I give her contact details of her cardiologist related to the heart monitor  To rule out multiple myeloma, I recommend complete blood work, 24 hour urine collection for UPEP and skeletal survey to rule out multiple myeloma Depending on test results, we may or may not proceed with bone marrow aspirate and biopsy. No orders of the defined types were placed in this encounter.   All questions were answered. The patient knows to call the clinic with any problems, questions or concerns. I spent 30 minutes counseling the patient face to face. The total time spent in the appointment was 30 minutes and more than 50% was on counseling.     Heath Lark, MD 06/16/22 3:20 PM  CHIEF COMPLAINTS/PURPOSE OF CONSULTATION:  Abnormal bone appearance on imaging   HISTORY OF PRESENTING ILLNESS:  Tamara Andrews 71 y.o. female is here because of recent abnormal bone imaging study found when she was hospitalized for stroke She denies history of abnormal bone pain or bone fracture. She has history of osteoporosis and have received Prolia in the past, none recently since she moved here 8 months ago Patient denies history of recurrent infection or atypical infections such as shingles of meningitis. Denies chills, night sweats, anorexia or abnormal weight loss. Her labs from hospital showed no evidence of anemia, renal failure or hypercalcemia SPEP is normal/unremarkable  MEDICAL HISTORY:   Past Medical History:  Diagnosis Date   Asthma    Essential hypertension    Vertigo     SURGICAL HISTORY: Past Surgical History:  Procedure Laterality Date   LUNG SURGERY      SOCIAL HISTORY: Social History   Socioeconomic History   Marital status: Single    Spouse name: Not on file   Number of children: Not on file   Years of education: Not on file   Highest education level: Not on file  Occupational History   Not on file  Tobacco Use   Smoking status: Never   Smokeless tobacco: Never  Substance and Sexual Activity   Alcohol use: Not Currently   Drug use: Never   Sexual activity: Not on file  Other Topics Concern   Not on file  Social History Narrative   Not on file   Social Determinants of Health   Financial Resource Strain: Not on file  Food Insecurity: Not on file  Transportation Needs: Not on file  Physical Activity: Not on file  Stress: Not on file  Social Connections: Not on file  Intimate Partner Violence: Not on file    FAMILY HISTORY: History reviewed. No pertinent family history.  ALLERGIES:  is allergic to lactose intolerance (gi).  MEDICATIONS:  Current Outpatient Medications  Medication Sig Dispense Refill   calcium carbonate (TUMS - DOSED IN MG ELEMENTAL CALCIUM) 500 MG chewable tablet Chew 1 tablet by mouth 2 (two) times daily.     VITAMIN D PO Take 1 tablet by mouth daily. Takes about 4000 units     aspirin 81 MG chewable  tablet Chew 1 tablet (81 mg total) by mouth daily. 30 tablet 11   atorvastatin (LIPITOR) 10 MG tablet Take 1 tablet (10 mg total) by mouth at bedtime. 30 tablet 11   clopidogrel (PLAVIX) 75 MG tablet Take 1 tablet (75 mg total) by mouth daily. 21 tablet 0   famotidine (PEPCID) 40 MG tablet Take 40 mg by mouth daily.     sertraline (ZOLOFT) 50 MG tablet Take 50 mg by mouth every morning.     valsartan (DIOVAN) 160 MG tablet Take 240 mg by mouth daily. 1.5 tablets     No current facility-administered medications for  this visit.    REVIEW OF SYSTEMS:   Eyes: Denies blurriness of vision, double vision or watery eyes Ears, nose, mouth, throat, and face: Denies mucositis or sore throat Respiratory: Denies cough, dyspnea or wheezes Cardiovascular: Denies palpitation, chest discomfort or lower extremity swelling Gastrointestinal:  Denies nausea, heartburn or change in bowel habits Skin: Denies abnormal skin rashes Lymphatics: Denies new lymphadenopathy or easy bruising Neurological:Denies numbness, tingling or new weaknesses Behavioral/Psych: Mood is stable, no new changes  All other systems were reviewed with the patient and are negative.  PHYSICAL EXAMINATION: ECOG PERFORMANCE STATUS: 1 - Symptomatic but completely ambulatory  Vitals:   06/16/22 1445  BP: (!) 143/69  Pulse: (!) 52  Resp: 18  Temp: 98.3 F (36.8 C)  SpO2: 100%   Filed Weights   06/16/22 1445  Weight: 113 lb 9.6 oz (51.5 kg)    GENERAL:alert, no distress and comfortable SKIN: skin color, texture, turgor are normal, no rashes or significant lesions EYES: normal, conjunctiva are pink and non-injected, sclera clear OROPHARYNX:no exudate, no erythema and lips, buccal mucosa, and tongue normal  NECK: supple, thyroid normal size, non-tender, without nodularity LYMPH:  no palpable lymphadenopathy in the cervical, axillary or inguinal LUNGS: clear to auscultation and percussion with normal breathing effort HEART: regular rate & rhythm and no murmurs and no lower extremity edema ABDOMEN:abdomen soft, non-tender and normal bowel sounds Musculoskeletal:no cyanosis of digits and no clubbing  PSYCH: alert & oriented x 3 with fluent speech NEURO: no focal motor/sensory deficits  LABORATORY DATA:  I have reviewed the data as listed Lab Results  Component Value Date   WBC 3.7 (L) 05/23/2022   HGB 12.9 05/23/2022   HCT 38.6 05/23/2022   MCV 90.2 05/23/2022   PLT 135 (L) 05/23/2022    RADIOGRAPHIC STUDIES: I have personally  reviewed the radiological images as listed and agreed with the findings in the report. [place] FNA BX THYROID 1ST LESION AFIRMA  Result Date: 06/10/2022 INDICATION: Left inferior thyroid nodule 2.2 cm EXAM: ULTRASOUND GUIDED FINE NEEDLE ASPIRATION OF INDETERMINATE THYROID NODULE COMPARISON:  US Thyroid 05/30/22. MEDICATIONS: 5 cc 1% lidocaine COMPLICATIONS: None immediate. TECHNIQUE: Informed written consent was obtained from the patient after a discussion of the risks, benefits and alternatives to treatment. Questions regarding the procedure were encouraged and answered. A timeout was performed prior to the initiation of the procedure. Pre-procedural ultrasound scanning demonstrated unchanged size and appearance of the indeterminate nodule within the left thyroid The procedure was planned. The neck was prepped in the usual sterile fashion, and a sterile drape was applied covering the operative field. A timeout was performed prior to the initiation of the procedure. Local anesthesia was provided with 1% lidocaine. Under direct ultrasound guidance, 5 FNA biopsies were performed of the left inferior thyroid nodule with a 27 gauge needle. 2 of these samples were obtained for [facility] Multiple  ultrasound images were saved for procedural documentation purposes. The samples were prepared and submitted to pathology. Limited post procedural scanning was negative for hematoma or additional complication. Dressings were placed. The patient tolerated the above procedures procedure well without immediate postprocedural complication. FINDINGS: Nodule reference number based on prior diagnostic ultrasound: 1 Maximum size: 2.2 cm Location: Left; Inferior ACR TI-RADS risk category: TR4 (4-6 points) Reason for biopsy: meets ACR TI-RADS criteria Ultrasound imaging confirms appropriate placement of the needles within the thyroid nodule. IMPRESSION: Technically successful ultrasound guided fine needle aspiration of left inferior thyroid  nodule Read by Lavonia Drafts [facility] Electronically Signed   By: Markus Daft M.D.   On: 06/10/2022 12:45   US THYROID  Result Date: 05/31/2022 CLINICAL DATA:  Thyroid nodule by CT EXAM: THYROID ULTRASOUND TECHNIQUE: Ultrasound examination of the thyroid gland and adjacent soft tissues was performed. COMPARISON:  05/22/2022 FINDINGS: Parenchymal Echotexture: Mildly heterogenous Isthmus: 3 mm Right lobe: 4.5 x 1.2 x 1.6 cm Left lobe: 5.7 x 1.5 x 1.5 cm _________________________________________________________ Estimated total number of nodules >/= 1 cm: 1 Number of spongiform nodules >/=  2 cm not described below (TR1): 0 Number of mixed cystic and solid nodules >/= 1.5 cm not described below ([place]): 0 _________________________________________________________ Nodule # 1: Location: Left; Inferior Maximum size: 2.2 cm; Other 2 dimensions: 1.4 x 1.3 cm Composition: solid/almost completely solid (2) Echogenicity: hypoechoic (2) Shape: not taller-than-wide (0) Margins: ill-defined (0) Echogenic foci: none (0) ACR TI-RADS total points: 4. ACR TI-RADS risk category: TR4 (4-6 points). ACR TI-RADS recommendations: **Given size (>/= 1.5 cm) and appearance, fine needle aspiration of this moderately suspicious nodule should be considered based on TI-RADS criteria. _________________________________________________________ There are additional scattered bilateral subcentimeter hypoechoic and cystic nodules noted all measuring 7 mm or less which would not meet criteria for any follow-up or biopsy. No regional adenopathy. IMPRESSION: 2.2 cm left inferior thyroid TR 4 nodule meets criteria for biopsy as above. This correlates with the CT finding. The above is in keeping with the ACR TI-RADS recommendations - J Am Coll Radiol 2017;14:587-595. Electronically Signed   By: Jerilynn Mages.  Shick M.D.   On: 05/31/2022 08:20   VAS [place] LOWER EXTREMITY VENOUS (DVT)  Result Date: 05/23/2022  Lower Venous DVT Study Patient Name:  MILLETTE HALBERSTAM  Date of  Exam:   05/23/2022 Medical Rec #: [id number]        Accession #:    [id number] Date of Birth: [birth year]        Patient Gender: F Patient Age:   17 years Exam Location:  [facility] Procedure:      VAS [place] LOWER EXTREMITY VENOUS (DVT) Referring Phys: MCNEILL KIRKPATRICK --------------------------------------------------------------------------------  Indications: Swelling, and stroke.  Comparison Study: No prior study Performing Technologist: Sharion Dove RVS  Examination Guidelines: A complete evaluation includes B-mode imaging, spectral Doppler, color Doppler, and power Doppler as needed of all accessible portions of each vessel. Bilateral testing is considered an integral part of a complete examination. Limited examinations for reoccurring indications may be performed as noted. The reflux portion of the exam is performed with the patient in reverse Trendelenburg.  +---------+---------------+---------+-----------+----------+--------------+ RIGHT    CompressibilityPhasicitySpontaneityPropertiesThrombus Aging +---------+---------------+---------+-----------+----------+--------------+ CFV      Full           Yes      Yes                                 +---------+---------------+---------+-----------+----------+--------------+  SFJ      Full                                                        +---------+---------------+---------+-----------+----------+--------------+ FV Prox  Full                                                        +---------+---------------+---------+-----------+----------+--------------+ FV Mid   Full                                                        +---------+---------------+---------+-----------+----------+--------------+ FV DistalFull                                                        +---------+---------------+---------+-----------+----------+--------------+ PFV      Full                                                         +---------+---------------+---------+-----------+----------+--------------+ POP      Full           Yes      Yes                                 +---------+---------------+---------+-----------+----------+--------------+ PTV      Full                                                        +---------+---------------+---------+-----------+----------+--------------+ PERO     Full                                                        +---------+---------------+---------+-----------+----------+--------------+ Gastroc  Full                                                        +---------+---------------+---------+-----------+----------+--------------+ The popliteal, posterior tibial, and gastrocnemius veins are dilated with rouleaux flow noted; however, all veins are easily compressible.  +---------+---------------+---------+-----------+----------+--------------+ LEFT     CompressibilityPhasicitySpontaneityPropertiesThrombus Aging +---------+---------------+---------+-----------+----------+--------------+ CFV      Full           Yes      Yes                                 +---------+---------------+---------+-----------+----------+--------------+  SFJ      Full                                                        +---------+---------------+---------+-----------+----------+--------------+ FV Prox  Full                                                        +---------+---------------+---------+-----------+----------+--------------+ FV Mid   Full                                                        +---------+---------------+---------+-----------+----------+--------------+ FV DistalFull                                                        +---------+---------------+---------+-----------+----------+--------------+ PFV      Full                                                         +---------+---------------+---------+-----------+----------+--------------+ POP      Full                                                        +---------+---------------+---------+-----------+----------+--------------+ PTV      Full                                                        +---------+---------------+---------+-----------+----------+--------------+ PERO     Full                                                        +---------+---------------+---------+-----------+----------+--------------+ Soleal   Full                                                        +---------+---------------+---------+-----------+----------+--------------+ Gastroc  Full                                                        +---------+---------------+---------+-----------+----------+--------------+  Summary: BILATERAL: - No evidence of deep vein thrombosis seen in the lower extremities, bilaterally. -No evidence of popliteal cyst, bilaterally. RIGHT: - The popliteal, posterior tibial, and gastrocnemius veins are dilated with rouleaux flow noted; however, all veins are easily compressible.   *See table(s) above for measurements and observations. Electronically signed by Orlie Pollen on 05/23/2022 at 7:02:09 PM.    Final    ECHOCARDIOGRAM COMPLETE BUBBLE STUDY  Result Date: 05/22/2022    ECHOCARDIOGRAM REPORT   Patient Name:   ADI SEALES Date of Exam: 05/22/2022 Medical Rec #:  [id number]       Height:       62.0 in Accession #:    [id number]      Weight:       113.0 lb Date of Birth:  1951/01/03       BSA:          1.500 m Patient Age:    71 years        BP:           129/73 mmHg Patient Gender: F               HR:           49 bpm. Exam Location:  Inpatient Procedure: 2D Echo, Cardiac Doppler, Color Doppler and Saline Contrast Bubble            Study Indications:    TIA  History:        Patient has no prior history of Echocardiogram examinations.                 Risk  Factors:Hypertension.  Sonographer:    Jyl Heinz Referring Phys: [id number] [place]  1. Left ventricular ejection fraction, by estimation, is 60 to 65%. The left ventricle has normal function. The left ventricle has no regional wall motion abnormalities. There is mild left ventricular hypertrophy. Left ventricular diastolic parameters are indeterminate.  2. Right ventricular systolic function is normal. The right ventricular size is normal. There is normal pulmonary artery systolic pressure. The estimated right ventricular systolic pressure is 73.4 mmHg.  3. The mitral valve is normal in structure. Trivial mitral valve regurgitation. No evidence of mitral stenosis.  4. The aortic valve is tricuspid. Aortic valve regurgitation is not visualized. No aortic stenosis is present.  5. The inferior vena cava is normal in size with greater than 50% respiratory variability, suggesting right atrial pressure of 3 mmHg.  6. Agitated saline contrast bubble study was positive with shunting observed within 3-6 cardiac cycles suggestive of interatrial shunt. Technically difficult bubble study, but appears to be small amount of shunting after ~5 cardiac cycles FINDINGS  Left Ventricle: Left ventricular ejection fraction, by estimation, is 60 to 65%. The left ventricle has normal function. The left ventricle has no regional wall motion abnormalities. The left ventricular internal cavity size was normal in size. There is  mild left ventricular hypertrophy. Left ventricular diastolic parameters are indeterminate. Right Ventricle: The right ventricular size is normal. No increase in right ventricular wall thickness. Right ventricular systolic function is normal. There is normal pulmonary artery systolic pressure. The tricuspid regurgitant velocity is 2.41 m/s, and  with an assumed right atrial pressure of 3 mmHg, the estimated right ventricular systolic pressure is 28.7 mmHg. Left Atrium: Left atrial size was  normal in size. Right Atrium: Right atrial size was normal in size. Pericardium: Trivial pericardial effusion is present. Mitral Valve: The mitral valve is normal in structure.  Trivial mitral valve regurgitation. No evidence of mitral valve stenosis. Tricuspid Valve: The tricuspid valve is normal in structure. Tricuspid valve regurgitation is trivial. Aortic Valve: The aortic valve is tricuspid. Aortic valve regurgitation is not visualized. No aortic stenosis is present. Aortic valve peak gradient measures 9.2 mmHg. Pulmonic Valve: The pulmonic valve was normal in structure. Pulmonic valve regurgitation is trivial. Aorta: The aortic root and ascending aorta are structurally normal, with no evidence of dilitation. Venous: The inferior vena cava is normal in size with greater than 50% respiratory variability, suggesting right atrial pressure of 3 mmHg. IAS/Shunts: The interatrial septum was not well visualized. Agitated saline contrast was given intravenously to evaluate for intracardiac shunting. Agitated saline contrast bubble study was positive with shunting observed within 3-6 cardiac cycles suggestive of interatrial shunt.  LEFT VENTRICLE PLAX 2D LVIDd:         4.10 cm     Diastology LVIDs:         2.70 cm     LV e' medial:    5.00 cm/s LV PW:         1.10 cm     LV E/e' medial:  17.4 LV IVS:        0.90 cm     LV e' lateral:   5.55 cm/s LVOT diam:     1.80 cm     LV E/e' lateral: 15.7 LV SV:         68 LV SV Index:   45 LVOT Area:     2.54 cm  LV Volumes (MOD) LV vol d, MOD A2C: 60.1 ml LV vol d, MOD A4C: 57.8 ml LV vol s, MOD A2C: 19.7 ml LV vol s, MOD A4C: 21.7 ml LV SV MOD A2C:     40.4 ml LV SV MOD A4C:     57.8 ml LV SV MOD BP:      39.4 ml RIGHT VENTRICLE             IVC RV Basal diam:  3.10 cm     IVC diam: 1.20 cm RV Mid diam:    2.70 cm RV S prime:     10.10 cm/s TAPSE (M-mode): 2.0 cm LEFT ATRIUM             Index        RIGHT ATRIUM           Index LA diam:        2.90 cm 1.93 cm/m   RA Area:      13.30 cm LA Vol (A2C):   32.8 ml 21.87 ml/m  RA Volume:   32.00 ml  21.34 ml/m LA Vol (A4C):   42.6 ml 28.40 ml/m LA Biplane Vol: 38.1 ml 25.40 ml/m  AORTIC VALVE AV Area (Vmax): 1.93 cm AV Vmax:        152.00 cm/s AV Peak Grad:   9.2 mmHg LVOT Vmax:      115.00 cm/s LVOT Vmean:     79.400 cm/s LVOT VTI:       0.267 m  AORTA Ao Root diam: 2.80 cm Ao Asc diam:  2.70 cm MITRAL VALVE               TRICUSPID VALVE MV Area (PHT): 4.08 cm    TR Peak grad:   23.2 mmHg MV Decel Time: 186 msec    TR Vmax:        241.00 cm/s MV E velocity: 87.00 cm/s MV A velocity: 98.50 cm/s  SHUNTS MV E/A ratio:  0.88        Systemic VTI:  0.27 m                            Systemic Diam: 1.80 cm Oswaldo Milian MD Electronically signed by Oswaldo Milian MD Signature Date/Time: 05/22/2022/2:42:23 PM    Final    MR Brain Wo Contrast (neuro protocol)  Result Date: 05/22/2022 CLINICAL DATA:  71 year old female with dizziness and right extremity weakness. Cerebellar infarcts on CT and abnormal right vertebral artery and basilar artery on CTA today. EXAM: MRI HEAD WITHOUT CONTRAST TECHNIQUE: Multiplanar, multiecho pulse sequences of the brain and surrounding structures were obtained without intravenous contrast. COMPARISON:  CTA head and neck, plain head CT earlier today. FINDINGS: Brain: Multiple right greater than left patchy and small cerebellar infarcts are chronic. And there is some chronic Wallerian degeneration suspected in the right cerebellar peduncle on series 8, image 7. Subtle curvilinear area of cortical or subcortical white matter restricted diffusion at the left superior perirolandic cortex (near right upper extremity representation area) only evident on axial DWI series 5, image 85). Faint if any acute T2 and FLAIR hyperintensity. Some chronic T2 and FLAIR signal changes in the white matter here with mild T2 shine through. No hemorrhage or mass effect. No other convincing restricted diffusion. No midline shift,  mass effect, evidence of mass lesion, ventriculomegaly, extra-axial collection or acute intracranial hemorrhage. Cervicomedullary junction and pituitary are within normal limits. Patchy bilateral mostly periventricular additional white matter T2 and FLAIR hyperintensity is moderate for age. No supratentorial cortical encephalomalacia, but there is evidence of small chronic micro hemorrhages in the right frontal horn white matter. Deep gray matter nuclei and brainstem within normal limits. Vascular: Major intracranial vascular flow voids dominant, tortuous distal left vertebral artery redemonstrated with no significant right vertebral V4 segment evident, suggesting congenital hypoplasia or remote occlusion. Other Major intracranial vascular flow voids are preserved. Skull and upper cervical spine: Mildly heterogeneous marrow signal as seen by CTA, but no destructive osseous lesion identified. Otherwise negative for age visible cervical spine. Sinuses/Orbits: Postoperative changes to both globes. Paranasal sinuses and mastoids are stable and well aerated. Other: Grossly normal visible internal auditory structures. IMPRESSION: 1. Subtle acute cortical/subcortical infarct in the Left Superior Perirolandic Cortex (near right upper extremity representation area). Underlying moderate chronic cerebral white matter changes. No associated hemorrhage or mass effect. 2. No other acute intracranial abnormality. 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 Electronically Signed   By: Genevie Ann M.D.   On: 05/22/2022 07:59   CT ANGIO HEAD W OR WO CONTRAST  Addendum Date: 05/22/2022   ADDENDUM REPORT: 05/22/2022 05:23 ADDENDUM: There should also be additional impressions reading: 4. Indeterminate heterogeneous bone mineralization. Recommend correlation with SPEP/UPEP to exclude the possibility of  Multiple Myeloma. 5. Left thyroid 16 mm nodule. Recommend thyroid US (ref: J Am Coll Radiol. 2015 Feb;12(2): 143-50). Electronically Signed   By: Genevie Ann M.D.   On: 05/22/2022 05:23   Result Date: 05/22/2022 CLINICAL DATA:  71 year old female with dizziness and right extremity weakness. 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 EXAM: CT ANGIOGRAPHY HEAD AND NECK TECHNIQUE: Multidetector CT imaging of the head and neck was performed using the standard protocol during bolus administration of intravenous contrast. Multiplanar CT image reconstructions and MIPs were obtained to evaluate the vascular anatomy.  Carotid stenosis measurements (when applicable) are obtained utilizing NASCET criteria, using the distal internal carotid diameter as the denominator. RADIATION DOSE REDUCTION: This exam was performed according to the departmental dose-optimization program which includes automated exposure control, adjustment of the mA and/or kV according to patient size and/or use of iterative reconstruction technique. CONTRAST:  53m OMNIPAQUE IOHEXOL 350 MG/ML SOLN COMPARISON:  Plain head CT 0135 hours. FINDINGS: CTA NECK Skeleton: Diffusely heterogeneous bone mineralization. Granular appearance of the spinal vertebrae, ribs. But no overt lytic osseous lesion identified. Upper chest: Negative; no superior mediastinal lymphadenopathy. Other neck: Hyperdense or hyperenhancing 16 mm left thyroid nodule on series 4, image 131. Otherwise within normal limits. Aortic arch: 3 vessel arch configuration with mild arch tortuosity. Minimal arch atherosclerosis. Right carotid system: Proximal right CCA partially obscured by dense right subclavian venous contrast streak artifact, but appears normal. Negative right carotid bifurcation. Mildly tortuous cervical right ICA. Left carotid system: Negative left CCA. Calcified plaque at the posterior left ICA origin with no significant stenosis. Vertebral arteries: No normal origin of the right vertebral  artery. But no occluded origin is evident. Little to no proximal right subclavian plaque. Highly diminutive right vertebral V2 segment appears reconstituted from thyrocervical and muscular branches, and right cervical transverse foramen is diminutive on series 6, image 201. Thread-like right vertebral artery functionally terminates outside the skull. Normal proximal left subclavian artery and left vertebral artery origin. Dominant but fairly normal caliber left vertebral artery is patent to the skull base with some tortuosity but no plaque or stenosis. CTA HEAD Posterior circulation: Fairly normal caliber left vertebral artery supplies the basilar. Normal left PICA origin. Patent right AICA appears to supply the right PICA. Patent basilar artery, but moderate irregularity and stenosis of the mid basilar as seen on series 10, image 22. Superimposed fetal type bilateral PCA origins. Basilar tip and SCA origins remain patent. Bilateral PCA branches are within normal limits. Anterior circulation: Both ICA siphons are patent. There is relatively mild bilateral siphon calcified plaque and no significant siphon stenosis. Both posterior communicating artery origins are normal. Patent carotid termini, MCA and ACA origins. Diminutive or absent anterior communicating artery. Bilateral ACA branches are within normal limits. Left MCA M1 segment and trifurcation are patent without stenosis. Right MCA M1 segment and trifurcation are patent without stenosis. Bilateral MCA branches are within normal limits. Venous sinuses: Early contrast timing, grossly patent superior sagittal sinus and transverse sinuses. Anatomic variants: Dominant appearing left vertebral artery. Review of the MIP images confirms the above findings IMPRESSION: 1. Abnormal Posterior Circulation, with Absent intracranial Right Vertebral Artery and Moderately irregular and stenotic Basilar artery. 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 But given right cerebellar infarcts on earlier Head CT (age indeterminate), a Right Vertebral Artery Occlusion (also age indeterminate) is not excluded. Follow-up Brain MRI (noncontrast should suffice) recommended to try to clarify. 2. The left vertebral is patent without stenosis and supplies the Basilar, and there are Fetal Type Bilateral PCA origins. 3. Mild for age Carotid and Anterior circulation plaque with no stenosis. Electronically Signed: By: HGenevie AnnM.D. On: 05/22/2022 05:19   CT ANGIO NECK W OR WO CONTRAST  Addendum Date: 05/22/2022   ADDENDUM REPORT: 05/22/2022 05:23 ADDENDUM: There should also be additional impressions reading: 4. Indeterminate heterogeneous bone mineralization. Recommend correlation with SPEP/UPEP to exclude the possibility of Multiple Myeloma. 5. Left thyroid 16 mm nodule. Recommend  thyroid US (ref: J Am Coll Radiol. 2015 Feb;12(2): 143-50). Electronically Signed   By: Genevie Ann M.D.   On: 05/22/2022 05:23   Result Date: 05/22/2022 CLINICAL DATA:  71 year old female with dizziness and right extremity weakness. 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 EXAM: CT ANGIOGRAPHY HEAD AND NECK TECHNIQUE: Multidetector CT imaging of the head and neck was performed using the standard protocol during bolus administration of intravenous contrast. Multiplanar CT image reconstructions and MIPs were obtained to evaluate the vascular anatomy. Carotid stenosis measurements (when applicable) are obtained utilizing NASCET criteria, using the distal internal carotid diameter as the denominator. RADIATION DOSE REDUCTION: This exam was performed according to the departmental dose-optimization program which includes automated exposure control, adjustment of the mA and/or kV according to patient size and/or use of iterative reconstruction technique. CONTRAST:  49m OMNIPAQUE IOHEXOL 350 MG/ML SOLN COMPARISON:  Plain head  CT 0135 hours. FINDINGS: CTA NECK Skeleton: Diffusely heterogeneous bone mineralization. Granular appearance of the spinal vertebrae, ribs. But no overt lytic osseous lesion identified. Upper chest: Negative; no superior mediastinal lymphadenopathy. Other neck: Hyperdense or hyperenhancing 16 mm left thyroid nodule on series 4, image 131. Otherwise within normal limits. Aortic arch: 3 vessel arch configuration with mild arch tortuosity. Minimal arch atherosclerosis. Right carotid system: Proximal right CCA partially obscured by dense right subclavian venous contrast streak artifact, but appears normal. Negative right carotid bifurcation. Mildly tortuous cervical right ICA. Left carotid system: Negative left CCA. Calcified plaque at the posterior left ICA origin with no significant stenosis. Vertebral arteries: No normal origin of the right vertebral artery. But no occluded origin is evident. Little to no proximal right subclavian plaque. Highly diminutive right vertebral V2 segment appears reconstituted from thyrocervical and muscular branches, and right cervical transverse foramen is diminutive on series 6, image 201. Thread-like right vertebral artery functionally terminates outside the skull. Normal proximal left subclavian artery and left vertebral artery origin. Dominant but fairly normal caliber left vertebral artery is patent to the skull base with some tortuosity but no plaque or stenosis. CTA HEAD Posterior circulation: Fairly normal caliber left vertebral artery supplies the basilar. Normal left PICA origin. Patent right AICA appears to supply the right PICA. Patent basilar artery, but moderate irregularity and stenosis of the mid basilar as seen on series 10, image 22. Superimposed fetal type bilateral PCA origins. Basilar tip and SCA origins remain patent. Bilateral PCA branches are within normal limits. Anterior circulation: Both ICA siphons are patent. There is relatively mild bilateral siphon  calcified plaque and no significant siphon stenosis. Both posterior communicating artery origins are normal. Patent carotid termini, MCA and ACA origins. Diminutive or absent anterior communicating artery. Bilateral ACA branches are within normal limits. Left MCA M1 segment and trifurcation are patent without stenosis. Right MCA M1 segment and trifurcation are patent without stenosis. Bilateral MCA branches are within normal limits. Venous sinuses: Early contrast timing, grossly patent superior sagittal sinus and transverse sinuses. Anatomic variants: Dominant appearing left vertebral artery. Review of the MIP images confirms the above findings IMPRESSION: 1. Abnormal Posterior Circulation, with Absent intracranial Right Vertebral Artery and Moderately irregular and stenotic Basilar artery. 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 But given right cerebellar infarcts on earlier Head CT (age indeterminate), a Right Vertebral Artery Occlusion (also age indeterminate) is not excluded. Follow-up Brain MRI (noncontrast should suffice) recommended to try to clarify. 2. The left vertebral is patent  without stenosis and supplies the Basilar, and there are Fetal Type Bilateral PCA origins. 3. Mild for age Carotid and Anterior circulation plaque with no stenosis. Electronically Signed: By: Genevie Ann M.D. On: 05/22/2022 05:19   CT HEAD WO CONTRAST  Result Date: 05/22/2022 CLINICAL DATA:  Dizziness with right extremity weakness. EXAM: CT HEAD WITHOUT CONTRAST TECHNIQUE: Contiguous axial images were obtained from the base of the skull through the vertex without intravenous contrast. RADIATION DOSE REDUCTION: This exam was performed according to the departmental dose-optimization program which includes automated exposure control, adjustment of the mA and/or kV according to patient size and/or use of iterative  reconstruction technique. COMPARISON:  None Available. FINDINGS: Brain: There is mild cerebral atrophy with widening of the extra-axial spaces and ventricular dilatation. There are areas of decreased attenuation within the white matter tracts of the supratentorial brain, consistent with microvascular disease changes. 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 Vascular: No hyperdense vessel or unexpected calcification. Skull: Normal. Negative for fracture or focal lesion. Sinuses/Orbits: No acute finding. Other: None. IMPRESSION: 1. Generalized cerebral atrophy and chronic white matter small vessel ischemic changes without evidence of an acute intracranial abnormality. 2. 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 MRI correlation is recommended. Electronically Signed   By: Virgina Norfolk M.D.   On: 05/22/2022 01:52

## 2022-06-16 NOTE — Assessment & Plan Note (Signed)
She has heart monitor in situ and has questions related to this I give her contact details of her cardiologist related to the heart monitor

## 2022-06-16 NOTE — Assessment & Plan Note (Signed)
Likely related to osteoporosis No signs of anemia, hypercalcemia or renal failure SPEP is normal She does not need long term follow-up

## 2022-06-16 NOTE — Assessment & Plan Note (Signed)
I recommend calcium, vitamin D and weight bearing exercises I suggest she review prolia treatment with her primary care doctor

## 2022-06-27 ENCOUNTER — Encounter (HOSPITAL_COMMUNITY): Payer: Self-pay

## 2022-07-12 DIAGNOSIS — Z23 Encounter for immunization: Secondary | ICD-10-CM | POA: Diagnosis not present

## 2022-07-12 DIAGNOSIS — Z1331 Encounter for screening for depression: Secondary | ICD-10-CM | POA: Diagnosis not present

## 2022-07-12 DIAGNOSIS — Z1339 Encounter for screening examination for other mental health and behavioral disorders: Secondary | ICD-10-CM | POA: Diagnosis not present

## 2022-07-12 DIAGNOSIS — Z0000 Encounter for general adult medical examination without abnormal findings: Secondary | ICD-10-CM | POA: Diagnosis not present

## 2022-07-12 DIAGNOSIS — Z7189 Other specified counseling: Secondary | ICD-10-CM | POA: Diagnosis not present

## 2022-07-12 DIAGNOSIS — M810 Age-related osteoporosis without current pathological fracture: Secondary | ICD-10-CM | POA: Diagnosis not present

## 2022-07-13 NOTE — Progress Notes (Deleted)
Office Note     CC:  *** Requesting Provider:  Lewis Moccasin, MD  HPI: Tamara Andrews is a 71 y.o. (12-05-1951) female who presents at the request of Lewis Moccasin, MD for evaluation of ***.   Venous symptoms include: positive if (X) [  ] aching [  ] heavy [  ] tired  [  ] throbbing [  ] burning  [  ] itching [  ]swelling [  ] bleeding [  ] ulcer  Onset/duration:  ***  Occupation:  *** Aggravating factors: (sitting, standing) Alleviating factors: (elevation) Compression:  *** Helps:  *** Pain medications:  *** Previous vein procedures:  *** History of DVT:  ***   The pt *** on a statin for cholesterol management.  The pt *** on a daily aspirin.   Other AC:  *** The pt *** on *** for hypertension.   The pt *** diabetic.  *** Tobacco hx:  ***  Past Medical History:  Diagnosis Date   Asthma    Essential hypertension    Vertigo     Past Surgical History:  Procedure Laterality Date   LUNG SURGERY      Social History   Socioeconomic History   Marital status: Single    Spouse name: Not on file   Number of children: Not on file   Years of education: Not on file   Highest education level: Not on file  Occupational History   Not on file  Tobacco Use   Smoking status: Never   Smokeless tobacco: Never  Substance and Sexual Activity   Alcohol use: Not Currently   Drug use: Never   Sexual activity: Not on file  Other Topics Concern   Not on file  Social History Narrative   Not on file   Social Determinants of Health   Financial Resource Strain: Not on file  Food Insecurity: Not on file  Transportation Needs: Not on file  Physical Activity: Not on file  Stress: Not on file  Social Connections: Not on file  Intimate Partner Violence: Not on file   ***No family history on file.  Current Outpatient Medications  Medication Sig Dispense Refill   aspirin 81 MG chewable tablet Chew 1 tablet (81 mg total) by mouth daily. 30 tablet 11    atorvastatin (LIPITOR) 10 MG tablet Take 1 tablet (10 mg total) by mouth at bedtime. 30 tablet 11   calcium carbonate (TUMS - DOSED IN MG ELEMENTAL CALCIUM) 500 MG chewable tablet Chew 1 tablet by mouth 2 (two) times daily.     clopidogrel (PLAVIX) 75 MG tablet Take 1 tablet (75 mg total) by mouth daily. 21 tablet 0   famotidine (PEPCID) 40 MG tablet Take 40 mg by mouth daily.     sertraline (ZOLOFT) 50 MG tablet Take 50 mg by mouth every morning.     valsartan (DIOVAN) 160 MG tablet Take 240 mg by mouth daily. 1.5 tablets     VITAMIN D PO Take 1 tablet by mouth daily. Takes about 4000 units     No current facility-administered medications for this visit.    Allergies  Allergen Reactions   Lactose Intolerance (Gi) Diarrhea     REVIEW OF SYSTEMS:  *** [X]  denotes positive finding, [ ]  denotes negative finding Cardiac  Comments:  Chest pain or chest pressure:    Shortness of breath upon exertion:    Short of breath when lying flat:    Irregular heart rhythm:  Vascular    Pain in calf, thigh, or hip brought on by ambulation:    Pain in feet at night that wakes you up from your sleep:     Blood clot in your veins:    Leg swelling:         Pulmonary    Oxygen at home:    Productive cough:     Wheezing:         Neurologic    Sudden weakness in arms or legs:     Sudden numbness in arms or legs:     Sudden onset of difficulty speaking or slurred speech:    Temporary loss of vision in one eye:     Problems with dizziness:         Gastrointestinal    Blood in stool:     Vomited blood:         Genitourinary    Burning when urinating:     Blood in urine:        Psychiatric    Major depression:         Hematologic    Bleeding problems:    Problems with blood clotting too easily:        Skin    Rashes or ulcers:        Constitutional    Fever or chills:      PHYSICAL EXAMINATION:  There were no vitals filed for this visit.  General:  WDWN in NAD; vital  signs documented above Gait: Not observed HENT: WNL, normocephalic Pulmonary: normal non-labored breathing , without Rales, rhonchi,  wheezing Cardiac: {Desc; regular/irreg:14544} HR, without  Murmurs {With/Without:20273} carotid bruit*** Abdomen: soft, NT, no masses Skin: {With/Without:20273} rashes Vascular Exam/Pulses:  Right Left  Radial {Exam; arterial pulse strength 0-4:30167} {Exam; arterial pulse strength 0-4:30167}  Ulnar {Exam; arterial pulse strength 0-4:30167} {Exam; arterial pulse strength 0-4:30167}  Femoral {Exam; arterial pulse strength 0-4:30167} {Exam; arterial pulse strength 0-4:30167}  Popliteal {Exam; arterial pulse strength 0-4:30167} {Exam; arterial pulse strength 0-4:30167}  DP {Exam; arterial pulse strength 0-4:30167} {Exam; arterial pulse strength 0-4:30167}  PT {Exam; arterial pulse strength 0-4:30167} {Exam; arterial pulse strength 0-4:30167}   Extremities: {With/Without:20273} ischemic changes, {With/Without:20273} Gangrene , {With/Without:20273} cellulitis; {With/Without:20273} open wounds;  Musculoskeletal: no muscle wasting or atrophy  Neurologic: A&O X 3;  No focal weakness or paresthesias are detected Psychiatric:  The pt has {Desc; normal/abnormal:11317::"Normal"} affect.   Non-Invasive Vascular Imaging:   ***    ASSESSMENT/PLAN:: 71 y.o. female presenting with ***   ***   Victorino Sparrow, MD [facility] [phone]

## 2022-07-15 ENCOUNTER — Encounter: Payer: Medicare PPO | Admitting: Vascular Surgery

## 2022-07-15 ENCOUNTER — Encounter (HOSPITAL_COMMUNITY): Payer: Medicare PPO

## 2022-07-19 ENCOUNTER — Other Ambulatory Visit (HOSPITAL_COMMUNITY): Payer: Self-pay

## 2022-07-20 ENCOUNTER — Other Ambulatory Visit (HOSPITAL_COMMUNITY): Payer: Self-pay

## 2022-07-22 ENCOUNTER — Other Ambulatory Visit (HOSPITAL_COMMUNITY): Payer: Self-pay

## 2022-07-25 ENCOUNTER — Encounter: Payer: Self-pay | Admitting: Diagnostic Neuroimaging

## 2022-07-25 ENCOUNTER — Ambulatory Visit (INDEPENDENT_AMBULATORY_CARE_PROVIDER_SITE_OTHER): Payer: Medicare PPO | Admitting: Diagnostic Neuroimaging

## 2022-07-25 VITALS — BP 140/65 | HR 57 | Ht 62.0 in | Wt 113.0 lb

## 2022-07-25 DIAGNOSIS — I63412 Cerebral infarction due to embolism of left middle cerebral artery: Secondary | ICD-10-CM | POA: Diagnosis not present

## 2022-07-25 NOTE — Patient Instructions (Signed)
-   continue aspirin 81mg  daily  - continue atorvastatin 10 mg nightly  - BP control

## 2022-07-25 NOTE — Progress Notes (Signed)
[facility]  PATIENT: Tamara Andrews DOB: Jul 18, 1951  REFERRING CLINICIAN: Alberteen Sam,* HISTORY FROM: patient  REASON FOR VISIT: new consult    HISTORICAL  CHIEF COMPLAINT:  Chief Complaint  Patient presents with   Cerebrovascular Accident    Rm 7 New Pt hospital FU "still a little dizzy occasionally; I did do exercises for dizziness which helped"    HISTORY OF PRESENT ILLNESS:   71 year old female here for evaluation of stroke.  Patient went to hospital on 05/22/2022 for right hand weakness.  Symptoms lasted about an hour.  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  Patient was noted to have some mild intracranial atherosclerotic changes.  Stroke work-up was completed.  Patient was treated with aspirin Plavix for 3 weeks and then aspirin alone.  She is on statin.  On blood pressure control.  Tolerating medications.  No residual symptoms.  Had heart monitor as outpatient.   REVIEW OF SYSTEMS: Full 14 system review of systems performed and negative with exception of: as per HPI.  ALLERGIES: Allergies  Allergen Reactions   Lactose Intolerance (Gi) Diarrhea   Methylprednisolone     Other reaction(s): Other Psychosis    HOME MEDICATIONS: Outpatient Medications Prior to Visit  Medication Sig Dispense Refill   aspirin 81 MG chewable tablet Chew 1 tablet (81 mg total) by mouth daily. 30 tablet 11   atorvastatin (LIPITOR) 10 MG tablet Take 1 tablet (10 mg total) by mouth at bedtime. 30 tablet 11   calcium carbonate (TUMS - DOSED IN MG ELEMENTAL CALCIUM) 500 MG chewable tablet Chew 1 tablet by mouth 2 (two) times daily.     Cholecalciferol (D3 PO) Take by mouth.     Cyanocobalamin (B-12 PO) Take by mouth.     denosumab (PROLIA) 60 MG/ML SOSY injection Inject 60 mg into the skin every 6 (six) months.     famotidine (PEPCID) 40 MG tablet Take 40 mg by mouth daily.     sertraline (ZOLOFT) 50 MG tablet  Take 50 mg by mouth every morning.     valsartan (DIOVAN) 160 MG tablet Take 240 mg by mouth daily. 1.5 tablets     VITAMIN D PO Take 1 tablet by mouth daily. Takes about 4000 units     vitamin E 1000 UNIT capsule Take 1,000 Units by mouth daily.     clopidogrel (PLAVIX) 75 MG tablet Take 1 tablet (75 mg total) by mouth daily. (Patient not taking: Reported on 07/25/2022) 21 tablet 0   No facility-administered medications prior to visit.    PAST MEDICAL HISTORY: Past Medical History:  Diagnosis Date   Asthma    Essential hypertension    Osteoporosis    Stroke (HCC)    Vertigo     PAST SURGICAL HISTORY: Past Surgical History:  Procedure Laterality Date   LUNG SURGERY     age 79    FAMILY HISTORY: Family History  Problem Relation Age of Onset   Heart attack Mother    Stroke Father     SOCIAL HISTORY: Social History   Socioeconomic History   Marital status: Single    Spouse name: Not on file   Number of children: 0   Years of education: Not on file   Highest education level: Not on file  Occupational History   Not on file  Tobacco Use   Smoking status: Never   Smokeless tobacco: Never  Substance and Sexual Activity   Alcohol  use: Not Currently   Drug use: Never   Sexual activity: Not on file  Other Topics Concern   Not on file  Social History Narrative   Lives alone   Social Determinants of Health   Financial Resource Strain: Not on file  Food Insecurity: Not on file  Transportation Needs: Not on file  Physical Activity: Not on file  Stress: Not on file  Social Connections: Not on file  Intimate Partner Violence: Not on file     PHYSICAL EXAM  GENERAL EXAM/CONSTITUTIONAL: Vitals:  Vitals:   07/25/22 1201  BP: (!) 140/65  Pulse: (!) 57  Weight: 113 lb (51.3 kg)  Height: 5\' 2"  (1.575 m)   Body mass index is 20.67 kg/m. Wt Readings from Last 3 Encounters:  07/25/22 113 lb (51.3 kg)  06/16/22 113 lb 9.6 oz (51.5 kg)  05/22/22 113 lb (51.3 kg)    Patient is in no distress; well developed, nourished and groomed; neck is supple  CARDIOVASCULAR: Examination of carotid arteries is normal; no carotid bruits Regular rate and rhythm, no murmurs Examination of peripheral vascular system by observation and palpation is normal  EYES: Ophthalmoscopic exam of optic discs and posterior segments is normal; no papilledema or hemorrhages No results found.  MUSCULOSKELETAL: Gait, strength, tone, movements noted in Neurologic exam below  NEUROLOGIC: MENTAL STATUS:      No data to display         awake, alert, oriented to person, place and time recent and remote memory intact normal attention and concentration language fluent, comprehension intact, naming intact fund of knowledge appropriate  CRANIAL NERVE:  2nd - no papilledema on fundoscopic exam 2nd, 3rd, 4th, 6th - pupils equal and reactive to light, visual fields full to confrontation, extraocular muscles intact, no nystagmus 5th - facial sensation symmetric 7th - facial strength symmetric 8th - hearing intact 9th - palate elevates symmetrically, uvula midline 11th - shoulder shrug symmetric 12th - tongue protrusion midline  MOTOR:  normal bulk and tone, full strength in the BUE, BLE  SENSORY:  normal and symmetric to light touch, temperature, vibration  COORDINATION:  finger-nose-finger, fine finger movements normal  REFLEXES:  deep tendon reflexes present and symmetric  GAIT/STATION:  narrow based gait     DIAGNOSTIC DATA (LABS, IMAGING, TESTING) - I reviewed patient records, labs, notes, testing and imaging myself where available.  Lab Results  Component Value Date   WBC 3.7 (L) 05/23/2022   HGB 12.9 05/23/2022   HCT 38.6 05/23/2022   MCV 90.2 05/23/2022   PLT 135 (L) 05/23/2022      Component Value Date/Time   NA 140 05/23/2022 0503   K 4.3 05/23/2022 0503   CL 109 05/23/2022 0503   CO2 26 05/23/2022 0503   GLUCOSE 81 05/23/2022 0503   BUN  13 05/23/2022 0503   CREATININE 0.80 05/23/2022 0503   CALCIUM 9.3 05/23/2022 0503   PROT 6.5 05/23/2022 0503   ALBUMIN 3.0 (L) 05/23/2022 0503   AST 22 05/23/2022 0503   ALT 27 05/23/2022 0503   ALKPHOS 51 05/23/2022 0503   BILITOT 0.6 05/23/2022 0503   GFRNONAA >60 05/23/2022 0503   Lab Results  Component Value Date   CHOL 140 05/22/2022   HDL 54 05/22/2022   LDLCALC 76 05/22/2022   TRIG 48 05/22/2022   CHOLHDL 2.6 05/22/2022   Lab Results  Component Value Date   HGBA1C 5.3 05/22/2022   No results found for: "VITAMINB12" No results found for: "TSH"  05/22/22  MRI brain 1. Subtle acute cortical/subcortical infarct in the Left Superior Perirolandic Cortex (near right upper extremity representation area). Underlying moderate chronic cerebral white matter changes. No associated hemorrhage or mass effect. 2. No other acute intracranial abnormality. 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   05/22/22 CTA head / neck 1. Abnormal Posterior Circulation, with Absent intracranial Right Vertebral Artery and Moderately irregular and stenotic Basilar artery. 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 But given right cerebellar infarcts on earlier Head CT (age indeterminate), a Right Vertebral Artery Occlusion (also age indeterminate) is not excluded. Follow-up Brain MRI (noncontrast should suffice) recommended to try to clarify. 2. The left vertebral is patent without stenosis and supplies the Basilar, and there are Fetal Type Bilateral PCA origins. 3. Mild for age Carotid and Anterior circulation plaque with no stenosis. 4. Indeterminate heterogeneous bone mineralization. Recommend correlation with SPEP/UPEP to exclude the possibility of  Multiple Myeloma. 5. Left thyroid 16 mm nodule. Recommend thyroid US (ref: J Am Coll Radiol. 2015 Feb;12(2): 143-50).  07/20/22 cardiac monitoring   Sinus rhythm including NSR, sinus bradycardia and sinus tachycardia   No serious arrhythmias observed.  05/22/22 TTE 1. Left ventricular ejection fraction, by estimation, is 60 to 65%. The  left ventricle has normal function. The left ventricle has no regional  wall motion abnormalities. There is mild left ventricular hypertrophy.  Left ventricular diastolic parameters  are indeterminate.   2. Right ventricular systolic function is normal. The right ventricular  size is normal. There is normal pulmonary artery systolic pressure. The  estimated right ventricular systolic pressure is 26.2 mmHg.   3. The mitral valve is normal in structure. Trivial mitral valve  regurgitation. No evidence of mitral stenosis.   4. The aortic valve is tricuspid. Aortic valve regurgitation is not  visualized. No aortic stenosis is present.   5. The inferior vena cava is normal in size with greater than 50%  respiratory variability, suggesting right atrial pressure of 3 mmHg.   6. Agitated saline contrast bubble study was positive with shunting  observed within 3-6 cardiac cycles suggestive of interatrial shunt.  Technically difficult bubble study, but appears to be small amount of  shunting after ~5 cardiac cycles    Carotid u/s  BILATERAL:  - No evidence of deep vein thrombosis seen in the lower extremities,  bilaterally.  -No evidence of popliteal cyst, bilaterally.        ASSESSMENT AND PLAN  71 y.o. year old female here with:   Dx:  1. Cerebrovascular accident (CVA) due to embolism of left middle cerebral artery (HCC)       PLAN:  STROKE PREVENTION (left frontal cortical; likely artery-artery embolism vs small vessel thrombosis) - continue aspirin 81mg  daily monotherapy (completed aspirin + plavix x 3 weeks) - continue lipitor 10 mg  nightly - BP control  Return for return to PCP, pending if symptoms worsen or fail to improve.    , MD 07/25/2022, 12:24 PM Certified in Neurology, Neurophysiology and Neuroimaging  [facility] [address] [place], [place] [id number]

## 2022-07-27 ENCOUNTER — Telehealth: Payer: Self-pay | Admitting: *Deleted

## 2022-07-27 ENCOUNTER — Encounter: Payer: Self-pay | Admitting: Cardiovascular Disease

## 2022-07-27 ENCOUNTER — Ambulatory Visit: Payer: Medicare PPO | Admitting: Cardiovascular Disease

## 2022-07-27 VITALS — BP 112/60 | HR 57 | Ht 62.0 in | Wt 113.8 lb

## 2022-07-27 DIAGNOSIS — I639 Cerebral infarction, unspecified: Secondary | ICD-10-CM | POA: Diagnosis not present

## 2022-07-27 DIAGNOSIS — I10 Essential (primary) hypertension: Secondary | ICD-10-CM | POA: Diagnosis not present

## 2022-07-27 NOTE — Telephone Encounter (Signed)
Called patient and advised her cardiac monitor study was unremarkable, continue medical management Patient verbalized understanding, appreciation.

## 2022-07-27 NOTE — Patient Instructions (Signed)
Medication Instructions:  Your physician recommends that you continue on your current medications as directed. Please refer to the Current Medication list given to you today. *If you need a refill on your cardiac medications before your next appointment, please call your pharmacy*  Follow-Up: At [facility], you and your health needs are our priority.  As part of our continuing mission to provide you with exceptional heart care, we have created designated Provider Care Teams.  These Care Teams include your primary Cardiologist (physician) and Advanced Practice Providers (APPs -  Physician Assistants and Nurse Practitioners) who all work together to provide you with the care you need, when you need it.  We recommend signing up for the patient portal called "MyChart".  Sign up information is provided on this After Visit Summary.  MyChart is used to connect with patients for Virtual Visits (Telemedicine).  Patients are able to view lab/test results, encounter notes, upcoming appointments, etc.  Non-urgent messages can be sent to your provider as well.   To learn more about what you can do with MyChart, go to [email or web address].    Your next appointment:   As needed   The format for your next appointment:   In Person  Provider:   Dr. Elease Hashimoto

## 2022-07-27 NOTE — Progress Notes (Unsigned)
Cardiology Office Note:    Date:  07/27/2022   ID:  Tamara Andrews, DOB 1951/11/23, MRN [id number]  PCP:  Lewis Moccasin, MD   [facility] Providers Cardiologist: Brick Ketcher    Referring MD: Alberteen Sam,*   Chief Complaint  Patient presents with   Hypertension    History of Present Illness:    Tamara Andrews is a 71 y.o. female with a hx of HTN She was admitted to [facility] hospital with a stroke  Former professor at [employer]   She was seen by Dr. 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    -Brain MRI revealed a sublte acute cortical / subcortical infarct in the left superior periolandic cortex Chronic bilateral cerebellar infarcts  -Event monitor revealed NSR, sinus brady, sinus tachycardia  No atrial fib was observed   -Echo on May 22, 2022 showed normal LV EF of 60-655 Mild LVH,  unable to determine diastolic function RV is normal  Trivial MR  + bubble study with Right to Left shunting suggestive of PFO or ASD   - venous duplex showed no DVT      Past Medical History:  Diagnosis Date   Asthma    Essential hypertension    Osteoporosis    Stroke [facility])    Vertigo     Past Surgical History:  Procedure Laterality Date   LUNG SURGERY     age 87    Current Medications: No outpatient medications have been marked as taking for the 07/27/22 encounter (Office Visit) with Clair Alfieri, Deloris Ping, MD.     Allergies:   Lactose intolerance (gi) and Methylprednisolone   Social History   Socioeconomic History   Marital status: Single    Spouse name: Not on file   Number of children: 0   Years of education: Not on file   Highest education level: Not on file  Occupational History   Not on file  Tobacco Use   Smoking status: Never   Smokeless  tobacco: Never  Substance and Sexual Activity   Alcohol use: Not Currently   Drug use: Never   Sexual activity: Not on file  Other Topics Concern   Not on file  Social History Narrative   Lives alone   Social Determinants of Health   Financial Resource Strain: Not on file  Food Insecurity: Not on file  Transportation Needs: Not on file  Physical Activity: Not on file  Stress: Not on file  Social Connections: Not on file     Family History: The patient's ***family history includes Heart attack in her mother; Stroke in her father.  ROS:   Please see the history of present illness.    *** All other systems reviewed and are negative.  EKGs/Labs/Other Studies Reviewed:    The following studies were reviewed today: ***  EKG: aug. 9, 2023: sinus bardy at 57. , Inc. RBBB  , poor R wave progression   Recent Labs: 05/22/2022: Magnesium 2.1 05/23/2022: ALT 27; BUN 13; Creatinine, Ser 0.80; Hemoglobin 12.9; Platelets 135; Potassium 4.3; Sodium 140  Recent Lipid Panel    Component Value Date/Time   CHOL 140 05/22/2022 0448   TRIG 48 05/22/2022 0448   HDL 54 05/22/2022 0448   CHOLHDL 2.6 05/22/2022 0448   VLDL 10  05/22/2022 0448   LDLCALC 76 05/22/2022 0448     Risk Assessment/Calculations:   {Does this patient have ATRIAL FIBRILLATION?:[id number]}       Physical Exam:    VS:  There were no vitals taken for this visit.    Wt Readings from Last 3 Encounters:  07/25/22 113 lb (51.3 kg)  06/16/22 113 lb 9.6 oz (51.5 kg)  05/22/22 113 lb (51.3 kg)     GEN: *** Well nourished, well developed in no acute distress HEENT: Normal NECK: No JVD; No carotid bruits LYMPHATICS: No lymphadenopathy CARDIAC: Rr , soft systolic murmu RESPIRATORY:  Clear to auscultation without rales, wheezing or rhonchi  ABDOMEN: Soft, non-tender, non-distended MUSCULOSKELETAL:  No edema; No deformity  SKIN: Warm and dry NEUROLOGIC:  Alert and oriented x 3 PSYCHIATRIC:  Normal affect    ASSESSMENT:    No diagnosis found. 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 Echo shows a small PFO.  Neuro remcommended continue medical theraphy with ASA   2. MR - ha mild MR       {Are you ordering a CV Procedure (e.g. stress test, cath, DCCV, TEE, etc)?   Press F2        :[id number]}    Medication Adjustments/Labs and Tests Ordered: Current medicines are reviewed at length with the patient today.  Concerns regarding medicines are outlined above.  No orders of the defined types were placed in this encounter.  No orders of the defined types were placed in this encounter.   There are no Patient Instructions on file for this visit.   Signed, Kristeen Miss, MD  07/27/2022 6:17 AM    [facility]

## 2022-08-18 DIAGNOSIS — E559 Vitamin D deficiency, unspecified: Secondary | ICD-10-CM | POA: Diagnosis not present

## 2022-08-18 DIAGNOSIS — M810 Age-related osteoporosis without current pathological fracture: Secondary | ICD-10-CM | POA: Diagnosis not present

## 2022-08-18 DIAGNOSIS — I10 Essential (primary) hypertension: Secondary | ICD-10-CM | POA: Diagnosis not present

## 2022-08-18 DIAGNOSIS — I639 Cerebral infarction, unspecified: Secondary | ICD-10-CM | POA: Diagnosis not present

## 2022-08-19 ENCOUNTER — Ambulatory Visit (HOSPITAL_COMMUNITY)
Admission: RE | Admit: 2022-08-19 | Discharge: 2022-08-19 | Disposition: A | Discharge status: Home / Self Care | Payer: Medicare PPO | Source: Ambulatory Visit | Attending: Vascular Surgery | Admitting: Vascular Surgery

## 2022-08-19 ENCOUNTER — Ambulatory Visit: Payer: Medicare PPO | Admitting: Vascular Surgery

## 2022-08-19 ENCOUNTER — Encounter: Payer: Self-pay | Admitting: Vascular Surgery

## 2022-08-19 VITALS — BP 158/71 | HR 47 | Temp 97.3°F | Resp 14 | Ht 62.0 in | Wt 115.0 lb

## 2022-08-19 DIAGNOSIS — M25472 Effusion, left ankle: Secondary | ICD-10-CM | POA: Diagnosis not present

## 2022-08-19 DIAGNOSIS — I872 Venous insufficiency (chronic) (peripheral): Secondary | ICD-10-CM | POA: Insufficient documentation

## 2022-08-19 NOTE — Progress Notes (Unsigned)
Office Note     CC: Asymmetric left lower extremity swelling Requesting Provider:  Lewis Moccasin, MD  HPI: Tamara Andrews is a 71 y.o. (1951/10/11) female who presents at the request of Lewis Moccasin, MD for evaluation of chronic left lower extremity swelling.  Tamara Andrews moved to [place] several years ago.  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  Over the last several years, she has appreciated asymptomatic unilateral left lower extremity swelling.  She denied significant heaviness, tired, throbbing feelings.  Denies bleeding or ulcerations, no varicose veins.  The left lower extremity has increased swelling by days end.  But usually is similar in size in the morning.  No previous venous procedures, has not attempted compression stockings.  No history of DVT, no history of vascular surgery.   Past Medical History:  Diagnosis Date   Asthma    Essential hypertension    Osteoporosis    Stroke [facility])    Vertigo     Past Surgical History:  Procedure Laterality Date   LUNG SURGERY     age 43    Social History   Socioeconomic History   Marital status: Single    Spouse name: Not on file   Number of children: 0   Years of education: Not on file   Highest education level: Not on file  Occupational History   Not on file  Tobacco Use   Smoking status: Never   Smokeless tobacco: Never  Substance and Sexual Activity   Alcohol use: Not Currently   Drug use: Never   Sexual activity: Not on file  Other Topics Concern   Not on file  Social History Narrative   Lives alone   Social Determinants of Health   Financial Resource Strain: Not on file  Food Insecurity: Not on file  Transportation Needs: Not on file  Physical Activity: Not on file  Stress: Not on file  Social Connections: Not on file  Intimate Partner Violence: Not on file   Family History  Problem Relation Age of Onset   Heart attack Mother    Stroke Father      Current Outpatient Medications  Medication Sig Dispense Refill   aspirin 81 MG chewable tablet Chew 1 tablet (81 mg total) by mouth daily. 30 tablet 11   atorvastatin (LIPITOR) 10 MG tablet Take 1 tablet (10 mg total) by mouth at bedtime. 30 tablet 11   calcium carbonate (TUMS - DOSED IN MG ELEMENTAL CALCIUM) 500 MG chewable tablet Chew 1 tablet by mouth 2 (two) times daily.     Cholecalciferol (D3 PO) Take by mouth.     Cyanocobalamin (B-12 PO) Take by mouth.     famotidine (PEPCID) 40 MG tablet Take 40 mg by mouth daily.     sertraline (ZOLOFT) 50 MG tablet Take 50 mg by mouth every morning.     valsartan (DIOVAN) 160 MG tablet Take 240 mg by mouth daily. 1.5 tablets     VITAMIN D PO Take 1 tablet by mouth daily. Takes about 4000 units     vitamin E 1000 UNIT capsule Take 1,000 Units by mouth daily.     No current facility-administered medications for this visit.    Allergies  Allergen Reactions   Lactose Intolerance (Gi) Diarrhea   Methylprednisolone     Other reaction(s): Other Psychosis     REVIEW OF SYSTEMS:  [X]  denotes positive finding, [ ]  denotes negative finding Cardiac  Comments:  Chest pain  or chest pressure:    Shortness of breath upon exertion:    Short of breath when lying flat:    Irregular heart rhythm:        Vascular    Pain in calf, thigh, or hip brought on by ambulation:    Pain in feet at night that wakes you up from your sleep:     Blood clot in your veins:    Leg swelling:         Pulmonary    Oxygen at home:    Productive cough:     Wheezing:         Neurologic    Sudden weakness in arms or legs:     Sudden numbness in arms or legs:     Sudden onset of difficulty speaking or slurred speech:    Temporary loss of vision in one eye:     Problems with dizziness:         Gastrointestinal    Blood in stool:     Vomited blood:         Genitourinary    Burning when urinating:     Blood in urine:        Psychiatric    Major  depression:         Hematologic    Bleeding problems:    Problems with blood clotting too easily:        Skin    Rashes or ulcers:        Constitutional    Fever or chills:      PHYSICAL EXAMINATION:  Vitals:   08/19/22 1425  BP: (!) 158/71  Pulse: (!) 47  Resp: 14  Temp: (!) 97.3 F (36.3 C)  TempSrc: Temporal  SpO2: 96%  Weight: 115 lb (52.2 kg)  Height: 5\' 2"  (1.575 m)    General:  WDWN in NAD; vital signs documented above Gait: Not observed HENT: WNL, normocephalic Pulmonary: normal non-labored breathing , without Rales, rhonchi,  wheezing Cardiac: regular HR, Abdomen: soft, NT, no masses Skin: without rashes Vascular Exam/Pulses:  Right Left  Radial 2+ (normal) 2+ (normal)  Ulnar 2+ (normal) 2+ (normal)  Femoral    Popliteal    DP 2+ (normal) 2+ (normal)  PT 2+ (normal) 2+ (normal)   Extremities: without ischemic changes, without Gangrene , without cellulitis; without open wounds;  Calf size no larger on the left as compared to the right. Some ankle edema L>R Musculoskeletal: no muscle wasting or atrophy  Neurologic: A&O X 3;  No focal weakness or paresthesias are detected Psychiatric:  The pt has Normal affect.   Non-Invasive Vascular Imaging:   +--------------+---------+------+-----------+------------+--------+  LEFT          Reflux NoRefluxReflux TimeDiameter cmsComments                          Yes                                   +--------------+---------+------+-----------+------------+--------+  CFV           no                                              +--------------+---------+------+-----------+------------+--------+  FV mid  no                                              +--------------+---------+------+-----------+------------+--------+  Popliteal     no                                              +--------------+---------+------+-----------+------------+--------+  GSV at [facility]    no                             0.41              +--------------+---------+------+-----------+------------+--------+  GSV prox thighno                            0.28              +--------------+---------+------+-----------+------------+--------+  GSV mid thigh no                            0.26              +--------------+---------+------+-----------+------------+--------+  GSV dist thighno                            0.23              +--------------+---------+------+-----------+------------+--------+  GSV at knee   no                            0.23              +--------------+---------+------+-----------+------------+--------+  GSV prox calf no                            0.11              +--------------+---------+------+-----------+------------+--------+  SSV Pop Fossa no                            0.17              +--------------+---------+------+-----------+------------+--------+     ASSESSMENT/PLAN:: 71 y.o. female presenting with lower extremity edema left greater than right.  On exam, Tamara Andrews was asymptomatic, but appreciated swelling especially at the level of the ankle.  This has been present for years.  She denies skin changes, wounds. Venous duplex ultrasound was reviewed demonstrating no venous insufficiency.  On physical exam, she had a palpable pulse in the foot with no asymmetry in the calf in circumference when measured.  Mild ankle edema present.  I do not have an etiology for her ankle edema.  Patient without venous insufficiency, arterial insufficiency. No physical exam findings consistent with lymphedema.  We discussed the role of compression stockings should the edema worsen or, should Tamara Andrews become symptomatic.  At this time, I think she would be best treated with continued active lifestyle and exercise regimen.   Victorino Sparrow, MD [facility] [phone]

## 2022-08-24 ENCOUNTER — Telehealth: Payer: Self-pay | Admitting: Diagnostic Neuroimaging

## 2022-08-24 NOTE — Telephone Encounter (Signed)
Pt called stating that she received a No Show letter for the date of 07/25/22. Pt did not miss appt and does not understand why she is receiving this letter. Pt would like letter to be taken off of her chart. Please advise.

## 2022-08-24 NOTE — Telephone Encounter (Signed)
Called patient and LVM informed her the letter will be taken off her record.

## 2022-10-25 DIAGNOSIS — Z23 Encounter for immunization: Secondary | ICD-10-CM | POA: Diagnosis not present

## 2022-10-25 DIAGNOSIS — G4700 Insomnia, unspecified: Secondary | ICD-10-CM | POA: Diagnosis not present

## 2022-10-25 DIAGNOSIS — F411 Generalized anxiety disorder: Secondary | ICD-10-CM | POA: Diagnosis not present

## 2022-10-25 DIAGNOSIS — F331 Major depressive disorder, recurrent, moderate: Secondary | ICD-10-CM | POA: Diagnosis not present

## 2022-10-25 DIAGNOSIS — E559 Vitamin D deficiency, unspecified: Secondary | ICD-10-CM | POA: Diagnosis not present

## 2022-10-25 DIAGNOSIS — I10 Essential (primary) hypertension: Secondary | ICD-10-CM | POA: Diagnosis not present

## 2023-01-02 DIAGNOSIS — I10 Essential (primary) hypertension: Secondary | ICD-10-CM | POA: Diagnosis not present

## 2023-01-02 DIAGNOSIS — F331 Major depressive disorder, recurrent, moderate: Secondary | ICD-10-CM | POA: Diagnosis not present

## 2023-01-02 DIAGNOSIS — G4700 Insomnia, unspecified: Secondary | ICD-10-CM | POA: Diagnosis not present

## 2023-01-02 DIAGNOSIS — F411 Generalized anxiety disorder: Secondary | ICD-10-CM | POA: Diagnosis not present

## 2023-02-13 ENCOUNTER — Telehealth: Payer: Self-pay | Admitting: Cardiovascular Disease

## 2023-02-13 ENCOUNTER — Telehealth: Payer: Self-pay

## 2023-02-13 NOTE — Telephone Encounter (Signed)
Pt called with c/o BLE foot swelling (L>R), denies pain/skin changes. She has not been wearing her compression hose. She notes it worsens during the day and improves when she elevates her legs at night. I have advised her to wear her compression hose during the day and elevate her legs periodically through the day. She is going to try this for a few days to see if she experiences any improvement and will call us back if not to schedule an appt. Pt verbalized understanding of the above.

## 2023-02-13 NOTE — Telephone Encounter (Signed)
Pt c/o swelling: STAT is pt has developed SOB within 24 hours  How much weight have you gained and in what time span? no  If swelling, where is the swelling located? In her feet  Are you currently taking a fluid pill? no  Are you currently SOB? no  Do you have a log of your daily weights (if so, list)? ni  Have you gained 3 pounds in a day or 5 pounds in a week? no  Have you traveled recently? no

## 2023-02-13 NOTE — Telephone Encounter (Signed)
Spoke with the patient who states that she has been having swelling in her feet. This has been an ongoing issue for the patient for a while now. She seems to think it has worsened recently. She states yesterday it was worse but she has been elevating her feet which has led to some improvement today. She denies any shortness of breath or weight gain. She denies any pain in her legs. She has been worked up by Vein and Vascular team in the past. She does have compression stockings that she hasn't been using which I have advised her to use along with continued elevation. She reports that she does avoid salt in her diet. Will make Dr. Acie Fredrickson aware.

## 2023-02-14 NOTE — Telephone Encounter (Signed)
Tamara Andrews, Tamara Cheng, MD  Bernestine Amass, RN23 hours ago (5:31 PM)    Continue leg elevation Recommend regular walking program Avoid salt / processed meats  I have openings in several weeks if she would like to be seen  PN   Returned call to patient to reiterate above comments. She had no further questions, doesn't feel like she needs to be seen at this time. States the elevation of BLE has further helped her swelling. Will call us if she wants to be seen.

## 2023-04-04 DIAGNOSIS — E559 Vitamin D deficiency, unspecified: Secondary | ICD-10-CM | POA: Diagnosis not present

## 2023-04-04 DIAGNOSIS — E782 Mixed hyperlipidemia: Secondary | ICD-10-CM | POA: Diagnosis not present

## 2023-04-04 DIAGNOSIS — E039 Hypothyroidism, unspecified: Secondary | ICD-10-CM | POA: Diagnosis not present

## 2023-04-04 DIAGNOSIS — I10 Essential (primary) hypertension: Secondary | ICD-10-CM | POA: Diagnosis not present

## 2023-05-11 DIAGNOSIS — E559 Vitamin D deficiency, unspecified: Secondary | ICD-10-CM | POA: Diagnosis not present

## 2023-05-11 DIAGNOSIS — I10 Essential (primary) hypertension: Secondary | ICD-10-CM | POA: Diagnosis not present

## 2023-05-11 DIAGNOSIS — E782 Mixed hyperlipidemia: Secondary | ICD-10-CM | POA: Diagnosis not present

## 2023-05-11 DIAGNOSIS — R609 Edema, unspecified: Secondary | ICD-10-CM | POA: Diagnosis not present

## 2023-05-11 DIAGNOSIS — Z23 Encounter for immunization: Secondary | ICD-10-CM | POA: Diagnosis not present

## 2023-06-01 DIAGNOSIS — R609 Edema, unspecified: Secondary | ICD-10-CM | POA: Diagnosis not present

## 2023-06-01 DIAGNOSIS — K635 Polyp of colon: Secondary | ICD-10-CM | POA: Diagnosis not present

## 2023-06-01 DIAGNOSIS — I872 Venous insufficiency (chronic) (peripheral): Secondary | ICD-10-CM | POA: Diagnosis not present

## 2023-06-01 DIAGNOSIS — I10 Essential (primary) hypertension: Secondary | ICD-10-CM | POA: Diagnosis not present

## 2023-06-05 ENCOUNTER — Other Ambulatory Visit: Payer: Self-pay | Admitting: Family Medicine

## 2023-06-05 DIAGNOSIS — Z1231 Encounter for screening mammogram for malignant neoplasm of breast: Secondary | ICD-10-CM

## 2023-06-12 ENCOUNTER — Ambulatory Visit
Admission: EM | Admit: 2023-06-12 | Discharge: 2023-06-12 | Disposition: A | Discharge status: Home / Self Care | Payer: Medicare PPO | Attending: Nurse Practitioner | Admitting: Nurse Practitioner

## 2023-06-12 DIAGNOSIS — R051 Acute cough: Secondary | ICD-10-CM | POA: Diagnosis not present

## 2023-06-12 DIAGNOSIS — J069 Acute upper respiratory infection, unspecified: Secondary | ICD-10-CM | POA: Insufficient documentation

## 2023-06-12 DIAGNOSIS — U071 COVID-19: Secondary | ICD-10-CM | POA: Diagnosis not present

## 2023-06-12 MED ORDER — BENZONATATE 100 MG PO CAPS
100.0000 mg | ORAL_CAPSULE | Freq: Three times a day (TID) | ORAL | 0 refills | Status: DC
Start: 2023-06-12 — End: 2023-11-27

## 2023-06-12 NOTE — Discharge Instructions (Signed)
The clinic will contact you with results of the COVID test done today if it is positive.  Take Tessalon 3 times a day as needed for your cough.  Rest and fluids.  Please follow-up with your PCP if your symptoms do not improve.  Please go to emergency room if you develop any worsening symptoms.  Hope you feel better soon!

## 2023-06-12 NOTE — ED Provider Notes (Signed)
[facility]    CSN: [id number] Arrival date & time: 06/12/23  1814      History   Chief Complaint Chief Complaint  Patient presents with   Cough    HPI Tamara Andrews is a 72 y.o. female  presents for evaluation of URI symptoms for 2 days. Patient reports associated symptoms of cough, congestion, sore throat. Denies N/V/D, fevers, ear pain, body aches, shortness of breath. Patient does not have a hx of asthma or smoking. No known sick contacts.  Pt has taken nothing OTC for symptoms.  She had a lot of phlegm yesterday and had difficulty getting it up causing her to feel like she was "choking".  This has not reoccurred today.  Pt has no other concerns at this time.    Cough Associated symptoms: sore throat     Past Medical History:  Diagnosis Date   Asthma    Essential hypertension    Osteoporosis    Stroke [facility])    Vertigo     Patient Active Problem List   Diagnosis Date Noted   Osteoporosis 06/16/2022   Thyroid nodule 05/23/2022   Abnormal bone radiograph 05/23/2022   Stroke (HCC) 05/22/2022   Essential hypertension     Past Surgical History:  Procedure Laterality Date   LUNG SURGERY     age 29    OB History   No obstetric history on file.      Home Medications    Prior to Admission medications   Medication Sig Start Date End Date Taking? Authorizing Provider  aspirin 81 MG chewable tablet Chew 1 tablet (81 mg total) by mouth daily. 05/24/22  Yes Danford, Earl Lites, MD  benzonatate (TESSALON) 100 MG capsule Take 1 capsule (100 mg total) by mouth every 8 (eight) hours. 06/12/23  Yes Radford Pax, NP  Cholecalciferol (D3 PO) Take by mouth.   Yes [provider]  Cyanocobalamin (B-12 PO) Take by mouth.   Yes [provider]  famotidine (PEPCID) 40 MG tablet Take 40 mg by mouth daily. 02/11/22  Yes [provider]  sertraline (ZOLOFT) 50 MG tablet Take 50 mg by mouth every morning. 04/18/22  Yes [provider]  valsartan (DIOVAN) 160 MG tablet Take 240 mg by mouth daily. 1.5 tablets   Yes [provider]  VITAMIN D PO Take 1 tablet by mouth daily. Takes about 4000 units   Yes [provider]  vitamin E 1000 UNIT capsule Take 1,000 Units by mouth daily.   Yes [provider]  atorvastatin (LIPITOR) 10 MG tablet Take 1 tablet (10 mg total) by mouth at bedtime. 05/23/22 05/23/23  Danford, Earl Lites, MD  calcium carbonate (TUMS - DOSED IN MG ELEMENTAL CALCIUM) 500 MG chewable tablet Chew 1 tablet by mouth 2 (two) times daily.    [provider]    Family History Family History  Problem Relation Age of Onset   Heart attack Mother    Stroke Father     Social History Social History   Tobacco Use   Smoking status: Never   Smokeless tobacco: Never  Vaping Use   Vaping Use: Never used  Substance Use Topics   Alcohol use: Not Currently   Drug use: Never     Allergies   Lactose intolerance (gi) and Methylprednisolone   Review of Systems Review of Systems  HENT:  Positive for congestion and sore throat.   Respiratory:  Positive for cough.      Physical  Exam Triage Vital Signs ED Triage Vitals [06/12/23 1946]  Enc Vitals Group     BP (!) 159/74     Pulse Rate 60     Resp 18     Temp 98.2 F (36.8 C)     Temp Source Oral     SpO2 96 %     Weight      Height      Head Circumference      Peak Flow      Pain Score      Pain Loc      Pain Edu?      Excl. in GC?    No data found.  Updated Vital Signs BP (!) 159/74 (BP Location: Right Arm)   Pulse 60   Temp 98.2 F (36.8 C) (Oral)   Resp 18   SpO2 96%   Visual Acuity Right Eye Distance:   Left Eye Distance:   Bilateral Distance:    Right Eye Near:   Left Eye Near:    Bilateral Near:     Physical Exam Vitals and nursing note reviewed.  Constitutional:      General: She is not in acute distress.    Appearance: She is well-developed. She is not ill-appearing.  HENT:      Head: Normocephalic and atraumatic.     Right Ear: Tympanic membrane and ear canal normal.     Left Ear: Tympanic membrane and ear canal normal.     Nose: Congestion present.     Mouth/Throat:     Mouth: Mucous membranes are moist.     Pharynx: Oropharynx is clear. Uvula midline. Posterior oropharyngeal erythema present.     Tonsils: No tonsillar exudate or tonsillar abscesses.  Eyes:     Conjunctiva/sclera: Conjunctivae normal.     Pupils: Pupils are equal, round, and reactive to light.  Cardiovascular:     Rate and Rhythm: Normal rate and regular rhythm.     Heart sounds: Normal heart sounds.  Pulmonary:     Effort: Pulmonary effort is normal.     Breath sounds: Normal breath sounds.  Musculoskeletal:     Cervical back: Normal range of motion and neck supple.  Lymphadenopathy:     Cervical: No cervical adenopathy.  Skin:    General: Skin is warm and dry.  Neurological:     General: No focal deficit present.     Mental Status: She is alert and oriented to person, place, and time.  Psychiatric:        Mood and Affect: Mood normal.        Behavior: Behavior normal.      UC Treatments / Results  Labs (all labs ordered are listed, but only abnormal results are displayed) Labs Reviewed  SARS CORONAVIRUS 2 (TAT 6-24 HRS)    EKG   Radiology No results found.  Procedures Procedures (including critical care time)  Medications Ordered in UC Medications - No data to display  Initial Impression / Assessment and Plan / UC Course  I have reviewed the triage vital signs and the nursing notes.  Pertinent labs & imaging results that were available during my care of the patient were reviewed by me and considered in my medical decision making (see chart for details).     COVID PCR and will contact if positive.  Patient is well-appearing and in no acute distress.  Vital signs are stable.  Exam unremarkable.  Will start Tessalon as needed cough.  Discussed viral illness and  symptomatic treatment.  Rest and fluids.  PCP follow-up if symptoms do not improve.  ER precautions reviewed and patient verbalized understanding Final Clinical Impressions(s) / UC Diagnoses   Final diagnoses:  Acute cough  Viral URI     Discharge Instructions      The clinic will contact you with results of the COVID test done today if it is positive.  Take Tessalon 3 times a day as needed for your cough.  Rest and fluids.  Please follow-up with your PCP if your symptoms do not improve.  Please go to emergency room if you develop any worsening symptoms.  Hope you feel better soon!    ED Prescriptions     Medication Sig Dispense Auth. Provider   benzonatate (TESSALON) 100 MG capsule Take 1 capsule (100 mg total) by mouth every 8 (eight) hours. 21 capsule Radford Pax, NP      PDMP not reviewed this encounter.   Radford Pax, NP 06/12/23 2008

## 2023-06-12 NOTE — ED Triage Notes (Signed)
Pt c/o hacking cough x2 days. Reports becoming choked on phlegm. Felt "something closed and something opened." Reports green mucous with runny nose.

## 2023-06-14 LAB — SARS CORONAVIRUS 2 (TAT 6-24 HRS): SARS Coronavirus 2: POSITIVE — AB

## 2023-06-20 DIAGNOSIS — J329 Chronic sinusitis, unspecified: Secondary | ICD-10-CM | POA: Diagnosis not present

## 2023-06-20 DIAGNOSIS — J069 Acute upper respiratory infection, unspecified: Secondary | ICD-10-CM | POA: Diagnosis not present

## 2023-06-20 DIAGNOSIS — U071 COVID-19: Secondary | ICD-10-CM | POA: Diagnosis not present

## 2023-06-20 DIAGNOSIS — B9689 Other specified bacterial agents as the cause of diseases classified elsewhere: Secondary | ICD-10-CM | POA: Diagnosis not present

## 2023-06-26 ENCOUNTER — Ambulatory Visit: Admission: RE | Admit: 2023-06-26 | Payer: Medicare PPO | Source: Ambulatory Visit

## 2023-06-26 ENCOUNTER — Other Ambulatory Visit: Payer: Self-pay | Admitting: Family Medicine

## 2023-06-26 DIAGNOSIS — R059 Cough, unspecified: Secondary | ICD-10-CM | POA: Diagnosis not present

## 2023-06-26 DIAGNOSIS — J069 Acute upper respiratory infection, unspecified: Secondary | ICD-10-CM

## 2023-06-26 DIAGNOSIS — J9811 Atelectasis: Secondary | ICD-10-CM | POA: Diagnosis not present

## 2023-07-06 DIAGNOSIS — I700 Atherosclerosis of aorta: Secondary | ICD-10-CM | POA: Diagnosis not present

## 2023-07-06 DIAGNOSIS — G4700 Insomnia, unspecified: Secondary | ICD-10-CM | POA: Diagnosis not present

## 2023-07-06 DIAGNOSIS — F411 Generalized anxiety disorder: Secondary | ICD-10-CM | POA: Diagnosis not present

## 2023-07-06 DIAGNOSIS — F331 Major depressive disorder, recurrent, moderate: Secondary | ICD-10-CM | POA: Diagnosis not present

## 2023-07-06 DIAGNOSIS — I10 Essential (primary) hypertension: Secondary | ICD-10-CM | POA: Diagnosis not present

## 2023-07-10 ENCOUNTER — Encounter: Payer: Self-pay | Admitting: Family Medicine

## 2023-07-10 ENCOUNTER — Other Ambulatory Visit: Payer: Self-pay | Admitting: Family Medicine

## 2023-07-10 DIAGNOSIS — M810 Age-related osteoporosis without current pathological fracture: Secondary | ICD-10-CM

## 2023-07-10 DIAGNOSIS — Z1231 Encounter for screening mammogram for malignant neoplasm of breast: Secondary | ICD-10-CM

## 2023-07-13 ENCOUNTER — Ambulatory Visit: Payer: Medicare PPO

## 2023-07-18 DIAGNOSIS — Z0000 Encounter for general adult medical examination without abnormal findings: Secondary | ICD-10-CM | POA: Diagnosis not present

## 2023-07-18 DIAGNOSIS — Z1339 Encounter for screening examination for other mental health and behavioral disorders: Secondary | ICD-10-CM | POA: Diagnosis not present

## 2023-07-18 DIAGNOSIS — H269 Unspecified cataract: Secondary | ICD-10-CM | POA: Diagnosis not present

## 2023-07-18 DIAGNOSIS — Z9181 History of falling: Secondary | ICD-10-CM | POA: Diagnosis not present

## 2023-07-18 DIAGNOSIS — Z1331 Encounter for screening for depression: Secondary | ICD-10-CM | POA: Diagnosis not present

## 2023-07-19 DIAGNOSIS — I69398 Other sequelae of cerebral infarction: Secondary | ICD-10-CM | POA: Diagnosis not present

## 2023-07-19 DIAGNOSIS — J45909 Unspecified asthma, uncomplicated: Secondary | ICD-10-CM | POA: Diagnosis not present

## 2023-07-19 DIAGNOSIS — I10 Essential (primary) hypertension: Secondary | ICD-10-CM | POA: Diagnosis not present

## 2023-07-19 DIAGNOSIS — F331 Major depressive disorder, recurrent, moderate: Secondary | ICD-10-CM | POA: Diagnosis not present

## 2023-07-19 DIAGNOSIS — E041 Nontoxic single thyroid nodule: Secondary | ICD-10-CM | POA: Diagnosis not present

## 2023-07-19 DIAGNOSIS — I708 Atherosclerosis of other arteries: Secondary | ICD-10-CM | POA: Diagnosis not present

## 2023-07-19 DIAGNOSIS — I051 Rheumatic mitral insufficiency: Secondary | ICD-10-CM | POA: Diagnosis not present

## 2023-07-19 DIAGNOSIS — I872 Venous insufficiency (chronic) (peripheral): Secondary | ICD-10-CM | POA: Diagnosis not present

## 2023-07-19 DIAGNOSIS — R269 Unspecified abnormalities of gait and mobility: Secondary | ICD-10-CM | POA: Diagnosis not present

## 2023-07-25 DIAGNOSIS — I051 Rheumatic mitral insufficiency: Secondary | ICD-10-CM | POA: Diagnosis not present

## 2023-07-25 DIAGNOSIS — I10 Essential (primary) hypertension: Secondary | ICD-10-CM | POA: Diagnosis not present

## 2023-07-25 DIAGNOSIS — I69398 Other sequelae of cerebral infarction: Secondary | ICD-10-CM | POA: Diagnosis not present

## 2023-07-25 DIAGNOSIS — R269 Unspecified abnormalities of gait and mobility: Secondary | ICD-10-CM | POA: Diagnosis not present

## 2023-07-25 DIAGNOSIS — J45909 Unspecified asthma, uncomplicated: Secondary | ICD-10-CM | POA: Diagnosis not present

## 2023-07-25 DIAGNOSIS — I708 Atherosclerosis of other arteries: Secondary | ICD-10-CM | POA: Diagnosis not present

## 2023-07-25 DIAGNOSIS — E041 Nontoxic single thyroid nodule: Secondary | ICD-10-CM | POA: Diagnosis not present

## 2023-07-25 DIAGNOSIS — I872 Venous insufficiency (chronic) (peripheral): Secondary | ICD-10-CM | POA: Diagnosis not present

## 2023-07-25 DIAGNOSIS — F331 Major depressive disorder, recurrent, moderate: Secondary | ICD-10-CM | POA: Diagnosis not present

## 2023-08-01 DIAGNOSIS — I10 Essential (primary) hypertension: Secondary | ICD-10-CM | POA: Diagnosis not present

## 2023-08-01 DIAGNOSIS — R269 Unspecified abnormalities of gait and mobility: Secondary | ICD-10-CM | POA: Diagnosis not present

## 2023-08-01 DIAGNOSIS — F331 Major depressive disorder, recurrent, moderate: Secondary | ICD-10-CM | POA: Diagnosis not present

## 2023-08-01 DIAGNOSIS — E041 Nontoxic single thyroid nodule: Secondary | ICD-10-CM | POA: Diagnosis not present

## 2023-08-01 DIAGNOSIS — I69398 Other sequelae of cerebral infarction: Secondary | ICD-10-CM | POA: Diagnosis not present

## 2023-08-01 DIAGNOSIS — I051 Rheumatic mitral insufficiency: Secondary | ICD-10-CM | POA: Diagnosis not present

## 2023-08-01 DIAGNOSIS — I872 Venous insufficiency (chronic) (peripheral): Secondary | ICD-10-CM | POA: Diagnosis not present

## 2023-08-01 DIAGNOSIS — J45909 Unspecified asthma, uncomplicated: Secondary | ICD-10-CM | POA: Diagnosis not present

## 2023-08-01 DIAGNOSIS — I708 Atherosclerosis of other arteries: Secondary | ICD-10-CM | POA: Diagnosis not present

## 2023-08-02 ENCOUNTER — Ambulatory Visit
Admission: RE | Admit: 2023-08-02 | Discharge: 2023-08-02 | Disposition: A | Discharge status: Home / Self Care | Payer: Medicare PPO | Source: Ambulatory Visit | Attending: Family Medicine | Admitting: Family Medicine

## 2023-08-02 ENCOUNTER — Ambulatory Visit: Payer: Medicare PPO

## 2023-08-02 DIAGNOSIS — Z1231 Encounter for screening mammogram for malignant neoplasm of breast: Secondary | ICD-10-CM

## 2023-08-03 DIAGNOSIS — R634 Abnormal weight loss: Secondary | ICD-10-CM | POA: Diagnosis not present

## 2023-08-03 DIAGNOSIS — F331 Major depressive disorder, recurrent, moderate: Secondary | ICD-10-CM | POA: Diagnosis not present

## 2023-08-03 DIAGNOSIS — F411 Generalized anxiety disorder: Secondary | ICD-10-CM | POA: Diagnosis not present

## 2023-08-03 DIAGNOSIS — G4700 Insomnia, unspecified: Secondary | ICD-10-CM | POA: Diagnosis not present

## 2023-08-03 DIAGNOSIS — I10 Essential (primary) hypertension: Secondary | ICD-10-CM | POA: Diagnosis not present

## 2023-08-03 DIAGNOSIS — Z23 Encounter for immunization: Secondary | ICD-10-CM | POA: Diagnosis not present

## 2023-08-07 DIAGNOSIS — R269 Unspecified abnormalities of gait and mobility: Secondary | ICD-10-CM | POA: Diagnosis not present

## 2023-08-07 DIAGNOSIS — I69398 Other sequelae of cerebral infarction: Secondary | ICD-10-CM | POA: Diagnosis not present

## 2023-08-07 DIAGNOSIS — F331 Major depressive disorder, recurrent, moderate: Secondary | ICD-10-CM | POA: Diagnosis not present

## 2023-08-07 DIAGNOSIS — I708 Atherosclerosis of other arteries: Secondary | ICD-10-CM | POA: Diagnosis not present

## 2023-08-07 DIAGNOSIS — I872 Venous insufficiency (chronic) (peripheral): Secondary | ICD-10-CM | POA: Diagnosis not present

## 2023-08-07 DIAGNOSIS — J45909 Unspecified asthma, uncomplicated: Secondary | ICD-10-CM | POA: Diagnosis not present

## 2023-08-07 DIAGNOSIS — E041 Nontoxic single thyroid nodule: Secondary | ICD-10-CM | POA: Diagnosis not present

## 2023-08-07 DIAGNOSIS — I051 Rheumatic mitral insufficiency: Secondary | ICD-10-CM | POA: Diagnosis not present

## 2023-08-07 DIAGNOSIS — I10 Essential (primary) hypertension: Secondary | ICD-10-CM | POA: Diagnosis not present

## 2023-08-08 DIAGNOSIS — R001 Bradycardia, unspecified: Secondary | ICD-10-CM | POA: Diagnosis not present

## 2023-08-08 DIAGNOSIS — I10 Essential (primary) hypertension: Secondary | ICD-10-CM | POA: Diagnosis not present

## 2023-08-08 DIAGNOSIS — R42 Dizziness and giddiness: Secondary | ICD-10-CM | POA: Diagnosis not present

## 2023-08-09 DIAGNOSIS — I10 Essential (primary) hypertension: Secondary | ICD-10-CM | POA: Diagnosis not present

## 2023-08-09 DIAGNOSIS — I051 Rheumatic mitral insufficiency: Secondary | ICD-10-CM | POA: Diagnosis not present

## 2023-08-09 DIAGNOSIS — J45909 Unspecified asthma, uncomplicated: Secondary | ICD-10-CM | POA: Diagnosis not present

## 2023-08-09 DIAGNOSIS — I872 Venous insufficiency (chronic) (peripheral): Secondary | ICD-10-CM | POA: Diagnosis not present

## 2023-08-09 DIAGNOSIS — I708 Atherosclerosis of other arteries: Secondary | ICD-10-CM | POA: Diagnosis not present

## 2023-08-09 DIAGNOSIS — I69398 Other sequelae of cerebral infarction: Secondary | ICD-10-CM | POA: Diagnosis not present

## 2023-08-09 DIAGNOSIS — E041 Nontoxic single thyroid nodule: Secondary | ICD-10-CM | POA: Diagnosis not present

## 2023-08-09 DIAGNOSIS — F331 Major depressive disorder, recurrent, moderate: Secondary | ICD-10-CM | POA: Diagnosis not present

## 2023-08-09 DIAGNOSIS — R269 Unspecified abnormalities of gait and mobility: Secondary | ICD-10-CM | POA: Diagnosis not present

## 2023-08-10 DIAGNOSIS — R269 Unspecified abnormalities of gait and mobility: Secondary | ICD-10-CM | POA: Diagnosis not present

## 2023-08-10 DIAGNOSIS — I10 Essential (primary) hypertension: Secondary | ICD-10-CM | POA: Diagnosis not present

## 2023-08-10 DIAGNOSIS — J45909 Unspecified asthma, uncomplicated: Secondary | ICD-10-CM | POA: Diagnosis not present

## 2023-08-10 DIAGNOSIS — E041 Nontoxic single thyroid nodule: Secondary | ICD-10-CM | POA: Diagnosis not present

## 2023-08-10 DIAGNOSIS — F331 Major depressive disorder, recurrent, moderate: Secondary | ICD-10-CM | POA: Diagnosis not present

## 2023-08-10 DIAGNOSIS — I708 Atherosclerosis of other arteries: Secondary | ICD-10-CM | POA: Diagnosis not present

## 2023-08-10 DIAGNOSIS — I872 Venous insufficiency (chronic) (peripheral): Secondary | ICD-10-CM | POA: Diagnosis not present

## 2023-08-10 DIAGNOSIS — I69398 Other sequelae of cerebral infarction: Secondary | ICD-10-CM | POA: Diagnosis not present

## 2023-08-10 DIAGNOSIS — I051 Rheumatic mitral insufficiency: Secondary | ICD-10-CM | POA: Diagnosis not present

## 2023-08-14 DIAGNOSIS — I10 Essential (primary) hypertension: Secondary | ICD-10-CM | POA: Diagnosis not present

## 2023-08-14 DIAGNOSIS — I708 Atherosclerosis of other arteries: Secondary | ICD-10-CM | POA: Diagnosis not present

## 2023-08-14 DIAGNOSIS — I69398 Other sequelae of cerebral infarction: Secondary | ICD-10-CM | POA: Diagnosis not present

## 2023-08-14 DIAGNOSIS — E041 Nontoxic single thyroid nodule: Secondary | ICD-10-CM | POA: Diagnosis not present

## 2023-08-14 DIAGNOSIS — I051 Rheumatic mitral insufficiency: Secondary | ICD-10-CM | POA: Diagnosis not present

## 2023-08-14 DIAGNOSIS — R269 Unspecified abnormalities of gait and mobility: Secondary | ICD-10-CM | POA: Diagnosis not present

## 2023-08-14 DIAGNOSIS — F331 Major depressive disorder, recurrent, moderate: Secondary | ICD-10-CM | POA: Diagnosis not present

## 2023-08-14 DIAGNOSIS — J45909 Unspecified asthma, uncomplicated: Secondary | ICD-10-CM | POA: Diagnosis not present

## 2023-08-14 DIAGNOSIS — I872 Venous insufficiency (chronic) (peripheral): Secondary | ICD-10-CM | POA: Diagnosis not present

## 2023-08-15 ENCOUNTER — Other Ambulatory Visit: Payer: Self-pay | Admitting: Family Medicine

## 2023-08-15 DIAGNOSIS — R410 Disorientation, unspecified: Secondary | ICD-10-CM

## 2023-08-16 DIAGNOSIS — K219 Gastro-esophageal reflux disease without esophagitis: Secondary | ICD-10-CM | POA: Diagnosis not present

## 2023-08-16 DIAGNOSIS — Z1211 Encounter for screening for malignant neoplasm of colon: Secondary | ICD-10-CM | POA: Diagnosis not present

## 2023-08-16 DIAGNOSIS — G459 Transient cerebral ischemic attack, unspecified: Secondary | ICD-10-CM | POA: Diagnosis not present

## 2023-08-17 DIAGNOSIS — E041 Nontoxic single thyroid nodule: Secondary | ICD-10-CM | POA: Diagnosis not present

## 2023-08-17 DIAGNOSIS — I69398 Other sequelae of cerebral infarction: Secondary | ICD-10-CM | POA: Diagnosis not present

## 2023-08-17 DIAGNOSIS — I10 Essential (primary) hypertension: Secondary | ICD-10-CM | POA: Diagnosis not present

## 2023-08-17 DIAGNOSIS — I051 Rheumatic mitral insufficiency: Secondary | ICD-10-CM | POA: Diagnosis not present

## 2023-08-17 DIAGNOSIS — I872 Venous insufficiency (chronic) (peripheral): Secondary | ICD-10-CM | POA: Diagnosis not present

## 2023-08-17 DIAGNOSIS — J45909 Unspecified asthma, uncomplicated: Secondary | ICD-10-CM | POA: Diagnosis not present

## 2023-08-17 DIAGNOSIS — F331 Major depressive disorder, recurrent, moderate: Secondary | ICD-10-CM | POA: Diagnosis not present

## 2023-08-17 DIAGNOSIS — I708 Atherosclerosis of other arteries: Secondary | ICD-10-CM | POA: Diagnosis not present

## 2023-08-17 DIAGNOSIS — R269 Unspecified abnormalities of gait and mobility: Secondary | ICD-10-CM | POA: Diagnosis not present

## 2023-08-18 DIAGNOSIS — I10 Essential (primary) hypertension: Secondary | ICD-10-CM | POA: Diagnosis not present

## 2023-08-18 DIAGNOSIS — I708 Atherosclerosis of other arteries: Secondary | ICD-10-CM | POA: Diagnosis not present

## 2023-08-18 DIAGNOSIS — R269 Unspecified abnormalities of gait and mobility: Secondary | ICD-10-CM | POA: Diagnosis not present

## 2023-08-18 DIAGNOSIS — E041 Nontoxic single thyroid nodule: Secondary | ICD-10-CM | POA: Diagnosis not present

## 2023-08-18 DIAGNOSIS — J45909 Unspecified asthma, uncomplicated: Secondary | ICD-10-CM | POA: Diagnosis not present

## 2023-08-18 DIAGNOSIS — I872 Venous insufficiency (chronic) (peripheral): Secondary | ICD-10-CM | POA: Diagnosis not present

## 2023-08-18 DIAGNOSIS — F331 Major depressive disorder, recurrent, moderate: Secondary | ICD-10-CM | POA: Diagnosis not present

## 2023-08-18 DIAGNOSIS — I051 Rheumatic mitral insufficiency: Secondary | ICD-10-CM | POA: Diagnosis not present

## 2023-08-18 DIAGNOSIS — I69398 Other sequelae of cerebral infarction: Secondary | ICD-10-CM | POA: Diagnosis not present

## 2023-08-22 DIAGNOSIS — R5383 Other fatigue: Secondary | ICD-10-CM | POA: Diagnosis not present

## 2023-08-22 DIAGNOSIS — R634 Abnormal weight loss: Secondary | ICD-10-CM | POA: Diagnosis not present

## 2023-08-22 DIAGNOSIS — I10 Essential (primary) hypertension: Secondary | ICD-10-CM | POA: Diagnosis not present

## 2023-08-23 DIAGNOSIS — I872 Venous insufficiency (chronic) (peripheral): Secondary | ICD-10-CM | POA: Diagnosis not present

## 2023-08-23 DIAGNOSIS — I708 Atherosclerosis of other arteries: Secondary | ICD-10-CM | POA: Diagnosis not present

## 2023-08-23 DIAGNOSIS — E041 Nontoxic single thyroid nodule: Secondary | ICD-10-CM | POA: Diagnosis not present

## 2023-08-23 DIAGNOSIS — F331 Major depressive disorder, recurrent, moderate: Secondary | ICD-10-CM | POA: Diagnosis not present

## 2023-08-23 DIAGNOSIS — I051 Rheumatic mitral insufficiency: Secondary | ICD-10-CM | POA: Diagnosis not present

## 2023-08-23 DIAGNOSIS — I10 Essential (primary) hypertension: Secondary | ICD-10-CM | POA: Diagnosis not present

## 2023-08-23 DIAGNOSIS — I69398 Other sequelae of cerebral infarction: Secondary | ICD-10-CM | POA: Diagnosis not present

## 2023-08-23 DIAGNOSIS — R269 Unspecified abnormalities of gait and mobility: Secondary | ICD-10-CM | POA: Diagnosis not present

## 2023-08-23 DIAGNOSIS — J45909 Unspecified asthma, uncomplicated: Secondary | ICD-10-CM | POA: Diagnosis not present

## 2023-08-24 DIAGNOSIS — R269 Unspecified abnormalities of gait and mobility: Secondary | ICD-10-CM | POA: Diagnosis not present

## 2023-08-24 DIAGNOSIS — J45909 Unspecified asthma, uncomplicated: Secondary | ICD-10-CM | POA: Diagnosis not present

## 2023-08-24 DIAGNOSIS — F331 Major depressive disorder, recurrent, moderate: Secondary | ICD-10-CM | POA: Diagnosis not present

## 2023-08-24 DIAGNOSIS — R001 Bradycardia, unspecified: Secondary | ICD-10-CM | POA: Diagnosis not present

## 2023-08-24 DIAGNOSIS — I051 Rheumatic mitral insufficiency: Secondary | ICD-10-CM | POA: Diagnosis not present

## 2023-08-24 DIAGNOSIS — I10 Essential (primary) hypertension: Secondary | ICD-10-CM | POA: Diagnosis not present

## 2023-08-24 DIAGNOSIS — R42 Dizziness and giddiness: Secondary | ICD-10-CM | POA: Diagnosis not present

## 2023-08-24 DIAGNOSIS — I872 Venous insufficiency (chronic) (peripheral): Secondary | ICD-10-CM | POA: Diagnosis not present

## 2023-08-24 DIAGNOSIS — I708 Atherosclerosis of other arteries: Secondary | ICD-10-CM | POA: Diagnosis not present

## 2023-08-24 DIAGNOSIS — E041 Nontoxic single thyroid nodule: Secondary | ICD-10-CM | POA: Diagnosis not present

## 2023-08-24 DIAGNOSIS — K219 Gastro-esophageal reflux disease without esophagitis: Secondary | ICD-10-CM | POA: Diagnosis not present

## 2023-08-24 DIAGNOSIS — I69398 Other sequelae of cerebral infarction: Secondary | ICD-10-CM | POA: Diagnosis not present

## 2023-08-24 DIAGNOSIS — Z7185 Encounter for immunization safety counseling: Secondary | ICD-10-CM | POA: Diagnosis not present

## 2023-08-25 DIAGNOSIS — I69398 Other sequelae of cerebral infarction: Secondary | ICD-10-CM | POA: Diagnosis not present

## 2023-08-25 DIAGNOSIS — R269 Unspecified abnormalities of gait and mobility: Secondary | ICD-10-CM | POA: Diagnosis not present

## 2023-08-25 DIAGNOSIS — I10 Essential (primary) hypertension: Secondary | ICD-10-CM | POA: Diagnosis not present

## 2023-08-25 DIAGNOSIS — I051 Rheumatic mitral insufficiency: Secondary | ICD-10-CM | POA: Diagnosis not present

## 2023-08-25 DIAGNOSIS — E041 Nontoxic single thyroid nodule: Secondary | ICD-10-CM | POA: Diagnosis not present

## 2023-08-25 DIAGNOSIS — F331 Major depressive disorder, recurrent, moderate: Secondary | ICD-10-CM | POA: Diagnosis not present

## 2023-08-25 DIAGNOSIS — I708 Atherosclerosis of other arteries: Secondary | ICD-10-CM | POA: Diagnosis not present

## 2023-08-25 DIAGNOSIS — I872 Venous insufficiency (chronic) (peripheral): Secondary | ICD-10-CM | POA: Diagnosis not present

## 2023-08-25 DIAGNOSIS — J45909 Unspecified asthma, uncomplicated: Secondary | ICD-10-CM | POA: Diagnosis not present

## 2023-08-31 DIAGNOSIS — I872 Venous insufficiency (chronic) (peripheral): Secondary | ICD-10-CM | POA: Diagnosis not present

## 2023-08-31 DIAGNOSIS — I69398 Other sequelae of cerebral infarction: Secondary | ICD-10-CM | POA: Diagnosis not present

## 2023-08-31 DIAGNOSIS — I10 Essential (primary) hypertension: Secondary | ICD-10-CM | POA: Diagnosis not present

## 2023-08-31 DIAGNOSIS — J45909 Unspecified asthma, uncomplicated: Secondary | ICD-10-CM | POA: Diagnosis not present

## 2023-08-31 DIAGNOSIS — I051 Rheumatic mitral insufficiency: Secondary | ICD-10-CM | POA: Diagnosis not present

## 2023-08-31 DIAGNOSIS — I708 Atherosclerosis of other arteries: Secondary | ICD-10-CM | POA: Diagnosis not present

## 2023-08-31 DIAGNOSIS — F331 Major depressive disorder, recurrent, moderate: Secondary | ICD-10-CM | POA: Diagnosis not present

## 2023-08-31 DIAGNOSIS — R269 Unspecified abnormalities of gait and mobility: Secondary | ICD-10-CM | POA: Diagnosis not present

## 2023-08-31 DIAGNOSIS — E041 Nontoxic single thyroid nodule: Secondary | ICD-10-CM | POA: Diagnosis not present

## 2023-09-01 ENCOUNTER — Ambulatory Visit
Admission: RE | Admit: 2023-09-01 | Discharge: 2023-09-01 | Disposition: A | Discharge status: Home / Self Care | Payer: Medicare PPO | Source: Ambulatory Visit | Attending: Family Medicine | Admitting: Family Medicine

## 2023-09-01 DIAGNOSIS — I6389 Other cerebral infarction: Secondary | ICD-10-CM | POA: Diagnosis not present

## 2023-09-01 DIAGNOSIS — R410 Disorientation, unspecified: Secondary | ICD-10-CM

## 2023-09-05 DIAGNOSIS — I10 Essential (primary) hypertension: Secondary | ICD-10-CM | POA: Diagnosis not present

## 2023-09-05 DIAGNOSIS — F331 Major depressive disorder, recurrent, moderate: Secondary | ICD-10-CM | POA: Diagnosis not present

## 2023-09-05 DIAGNOSIS — R410 Disorientation, unspecified: Secondary | ICD-10-CM | POA: Diagnosis not present

## 2023-09-05 DIAGNOSIS — F0150 Vascular dementia without behavioral disturbance: Secondary | ICD-10-CM | POA: Diagnosis not present

## 2023-09-05 DIAGNOSIS — I639 Cerebral infarction, unspecified: Secondary | ICD-10-CM | POA: Diagnosis not present

## 2023-09-05 DIAGNOSIS — R634 Abnormal weight loss: Secondary | ICD-10-CM | POA: Diagnosis not present

## 2023-09-06 DIAGNOSIS — F331 Major depressive disorder, recurrent, moderate: Secondary | ICD-10-CM | POA: Diagnosis not present

## 2023-09-06 DIAGNOSIS — I10 Essential (primary) hypertension: Secondary | ICD-10-CM | POA: Diagnosis not present

## 2023-09-06 DIAGNOSIS — G4700 Insomnia, unspecified: Secondary | ICD-10-CM | POA: Diagnosis not present

## 2023-09-06 DIAGNOSIS — K635 Polyp of colon: Secondary | ICD-10-CM | POA: Diagnosis not present

## 2023-09-06 DIAGNOSIS — I639 Cerebral infarction, unspecified: Secondary | ICD-10-CM | POA: Diagnosis not present

## 2023-09-06 DIAGNOSIS — F411 Generalized anxiety disorder: Secondary | ICD-10-CM | POA: Diagnosis not present

## 2023-09-06 DIAGNOSIS — Z23 Encounter for immunization: Secondary | ICD-10-CM | POA: Diagnosis not present

## 2023-09-08 DIAGNOSIS — F331 Major depressive disorder, recurrent, moderate: Secondary | ICD-10-CM | POA: Diagnosis not present

## 2023-09-08 DIAGNOSIS — I69398 Other sequelae of cerebral infarction: Secondary | ICD-10-CM | POA: Diagnosis not present

## 2023-09-08 DIAGNOSIS — E041 Nontoxic single thyroid nodule: Secondary | ICD-10-CM | POA: Diagnosis not present

## 2023-09-08 DIAGNOSIS — J45909 Unspecified asthma, uncomplicated: Secondary | ICD-10-CM | POA: Diagnosis not present

## 2023-09-08 DIAGNOSIS — I708 Atherosclerosis of other arteries: Secondary | ICD-10-CM | POA: Diagnosis not present

## 2023-09-08 DIAGNOSIS — I10 Essential (primary) hypertension: Secondary | ICD-10-CM | POA: Diagnosis not present

## 2023-09-08 DIAGNOSIS — I051 Rheumatic mitral insufficiency: Secondary | ICD-10-CM | POA: Diagnosis not present

## 2023-09-08 DIAGNOSIS — I872 Venous insufficiency (chronic) (peripheral): Secondary | ICD-10-CM | POA: Diagnosis not present

## 2023-09-08 DIAGNOSIS — R269 Unspecified abnormalities of gait and mobility: Secondary | ICD-10-CM | POA: Diagnosis not present

## 2023-09-12 DIAGNOSIS — I872 Venous insufficiency (chronic) (peripheral): Secondary | ICD-10-CM | POA: Diagnosis not present

## 2023-09-12 DIAGNOSIS — G4700 Insomnia, unspecified: Secondary | ICD-10-CM | POA: Diagnosis not present

## 2023-09-12 DIAGNOSIS — I051 Rheumatic mitral insufficiency: Secondary | ICD-10-CM | POA: Diagnosis not present

## 2023-09-12 DIAGNOSIS — E041 Nontoxic single thyroid nodule: Secondary | ICD-10-CM | POA: Diagnosis not present

## 2023-09-12 DIAGNOSIS — J45909 Unspecified asthma, uncomplicated: Secondary | ICD-10-CM | POA: Diagnosis not present

## 2023-09-12 DIAGNOSIS — F0150 Vascular dementia without behavioral disturbance: Secondary | ICD-10-CM | POA: Diagnosis not present

## 2023-09-12 DIAGNOSIS — I639 Cerebral infarction, unspecified: Secondary | ICD-10-CM | POA: Diagnosis not present

## 2023-09-12 DIAGNOSIS — H6123 Impacted cerumen, bilateral: Secondary | ICD-10-CM | POA: Diagnosis not present

## 2023-09-12 DIAGNOSIS — I10 Essential (primary) hypertension: Secondary | ICD-10-CM | POA: Diagnosis not present

## 2023-09-12 DIAGNOSIS — I69398 Other sequelae of cerebral infarction: Secondary | ICD-10-CM | POA: Diagnosis not present

## 2023-09-12 DIAGNOSIS — F411 Generalized anxiety disorder: Secondary | ICD-10-CM | POA: Diagnosis not present

## 2023-09-12 DIAGNOSIS — R269 Unspecified abnormalities of gait and mobility: Secondary | ICD-10-CM | POA: Diagnosis not present

## 2023-09-12 DIAGNOSIS — F331 Major depressive disorder, recurrent, moderate: Secondary | ICD-10-CM | POA: Diagnosis not present

## 2023-09-12 DIAGNOSIS — I708 Atherosclerosis of other arteries: Secondary | ICD-10-CM | POA: Diagnosis not present

## 2023-09-14 DIAGNOSIS — Z86010 Personal history of colonic polyps: Secondary | ICD-10-CM | POA: Diagnosis not present

## 2023-09-14 DIAGNOSIS — K635 Polyp of colon: Secondary | ICD-10-CM | POA: Diagnosis not present

## 2023-09-14 DIAGNOSIS — K5730 Diverticulosis of large intestine without perforation or abscess without bleeding: Secondary | ICD-10-CM | POA: Diagnosis not present

## 2023-09-14 DIAGNOSIS — D122 Benign neoplasm of ascending colon: Secondary | ICD-10-CM | POA: Diagnosis not present

## 2023-09-15 DIAGNOSIS — I708 Atherosclerosis of other arteries: Secondary | ICD-10-CM | POA: Diagnosis not present

## 2023-09-15 DIAGNOSIS — R269 Unspecified abnormalities of gait and mobility: Secondary | ICD-10-CM | POA: Diagnosis not present

## 2023-09-15 DIAGNOSIS — I10 Essential (primary) hypertension: Secondary | ICD-10-CM | POA: Diagnosis not present

## 2023-09-15 DIAGNOSIS — E041 Nontoxic single thyroid nodule: Secondary | ICD-10-CM | POA: Diagnosis not present

## 2023-09-15 DIAGNOSIS — I872 Venous insufficiency (chronic) (peripheral): Secondary | ICD-10-CM | POA: Diagnosis not present

## 2023-09-15 DIAGNOSIS — I051 Rheumatic mitral insufficiency: Secondary | ICD-10-CM | POA: Diagnosis not present

## 2023-09-15 DIAGNOSIS — F331 Major depressive disorder, recurrent, moderate: Secondary | ICD-10-CM | POA: Diagnosis not present

## 2023-09-15 DIAGNOSIS — J45909 Unspecified asthma, uncomplicated: Secondary | ICD-10-CM | POA: Diagnosis not present

## 2023-09-15 DIAGNOSIS — I69398 Other sequelae of cerebral infarction: Secondary | ICD-10-CM | POA: Diagnosis not present

## 2023-09-17 DIAGNOSIS — I10 Essential (primary) hypertension: Secondary | ICD-10-CM | POA: Diagnosis not present

## 2023-09-17 DIAGNOSIS — F331 Major depressive disorder, recurrent, moderate: Secondary | ICD-10-CM | POA: Diagnosis not present

## 2023-09-17 DIAGNOSIS — I69315 Cognitive social or emotional deficit following cerebral infarction: Secondary | ICD-10-CM | POA: Diagnosis not present

## 2023-09-17 DIAGNOSIS — I708 Atherosclerosis of other arteries: Secondary | ICD-10-CM | POA: Diagnosis not present

## 2023-09-17 DIAGNOSIS — I69398 Other sequelae of cerebral infarction: Secondary | ICD-10-CM | POA: Diagnosis not present

## 2023-09-17 DIAGNOSIS — R269 Unspecified abnormalities of gait and mobility: Secondary | ICD-10-CM | POA: Diagnosis not present

## 2023-09-17 DIAGNOSIS — E041 Nontoxic single thyroid nodule: Secondary | ICD-10-CM | POA: Diagnosis not present

## 2023-09-17 DIAGNOSIS — I872 Venous insufficiency (chronic) (peripheral): Secondary | ICD-10-CM | POA: Diagnosis not present

## 2023-09-17 DIAGNOSIS — I051 Rheumatic mitral insufficiency: Secondary | ICD-10-CM | POA: Diagnosis not present

## 2023-09-19 DIAGNOSIS — I872 Venous insufficiency (chronic) (peripheral): Secondary | ICD-10-CM | POA: Diagnosis not present

## 2023-09-19 DIAGNOSIS — F331 Major depressive disorder, recurrent, moderate: Secondary | ICD-10-CM | POA: Diagnosis not present

## 2023-09-19 DIAGNOSIS — I69398 Other sequelae of cerebral infarction: Secondary | ICD-10-CM | POA: Diagnosis not present

## 2023-09-19 DIAGNOSIS — E041 Nontoxic single thyroid nodule: Secondary | ICD-10-CM | POA: Diagnosis not present

## 2023-09-19 DIAGNOSIS — R269 Unspecified abnormalities of gait and mobility: Secondary | ICD-10-CM | POA: Diagnosis not present

## 2023-09-19 DIAGNOSIS — I10 Essential (primary) hypertension: Secondary | ICD-10-CM | POA: Diagnosis not present

## 2023-09-19 DIAGNOSIS — I708 Atherosclerosis of other arteries: Secondary | ICD-10-CM | POA: Diagnosis not present

## 2023-09-19 DIAGNOSIS — I051 Rheumatic mitral insufficiency: Secondary | ICD-10-CM | POA: Diagnosis not present

## 2023-09-19 DIAGNOSIS — J45909 Unspecified asthma, uncomplicated: Secondary | ICD-10-CM | POA: Diagnosis not present

## 2023-09-22 DIAGNOSIS — E041 Nontoxic single thyroid nodule: Secondary | ICD-10-CM | POA: Diagnosis not present

## 2023-09-22 DIAGNOSIS — I69315 Cognitive social or emotional deficit following cerebral infarction: Secondary | ICD-10-CM | POA: Diagnosis not present

## 2023-09-22 DIAGNOSIS — F331 Major depressive disorder, recurrent, moderate: Secondary | ICD-10-CM | POA: Diagnosis not present

## 2023-09-22 DIAGNOSIS — I69398 Other sequelae of cerebral infarction: Secondary | ICD-10-CM | POA: Diagnosis not present

## 2023-09-22 DIAGNOSIS — I051 Rheumatic mitral insufficiency: Secondary | ICD-10-CM | POA: Diagnosis not present

## 2023-09-22 DIAGNOSIS — R269 Unspecified abnormalities of gait and mobility: Secondary | ICD-10-CM | POA: Diagnosis not present

## 2023-09-22 DIAGNOSIS — I10 Essential (primary) hypertension: Secondary | ICD-10-CM | POA: Diagnosis not present

## 2023-09-22 DIAGNOSIS — I872 Venous insufficiency (chronic) (peripheral): Secondary | ICD-10-CM | POA: Diagnosis not present

## 2023-09-22 DIAGNOSIS — I708 Atherosclerosis of other arteries: Secondary | ICD-10-CM | POA: Diagnosis not present

## 2023-09-26 DIAGNOSIS — I10 Essential (primary) hypertension: Secondary | ICD-10-CM | POA: Diagnosis not present

## 2023-09-26 DIAGNOSIS — F0150 Vascular dementia without behavioral disturbance: Secondary | ICD-10-CM | POA: Diagnosis not present

## 2023-09-27 DIAGNOSIS — I69398 Other sequelae of cerebral infarction: Secondary | ICD-10-CM | POA: Diagnosis not present

## 2023-09-27 DIAGNOSIS — I708 Atherosclerosis of other arteries: Secondary | ICD-10-CM | POA: Diagnosis not present

## 2023-09-27 DIAGNOSIS — I69315 Cognitive social or emotional deficit following cerebral infarction: Secondary | ICD-10-CM | POA: Diagnosis not present

## 2023-09-27 DIAGNOSIS — I872 Venous insufficiency (chronic) (peripheral): Secondary | ICD-10-CM | POA: Diagnosis not present

## 2023-09-27 DIAGNOSIS — I10 Essential (primary) hypertension: Secondary | ICD-10-CM | POA: Diagnosis not present

## 2023-09-27 DIAGNOSIS — R269 Unspecified abnormalities of gait and mobility: Secondary | ICD-10-CM | POA: Diagnosis not present

## 2023-09-27 DIAGNOSIS — E041 Nontoxic single thyroid nodule: Secondary | ICD-10-CM | POA: Diagnosis not present

## 2023-09-27 DIAGNOSIS — F331 Major depressive disorder, recurrent, moderate: Secondary | ICD-10-CM | POA: Diagnosis not present

## 2023-09-27 DIAGNOSIS — I051 Rheumatic mitral insufficiency: Secondary | ICD-10-CM | POA: Diagnosis not present

## 2023-10-03 DIAGNOSIS — I10 Essential (primary) hypertension: Secondary | ICD-10-CM | POA: Diagnosis not present

## 2023-10-03 DIAGNOSIS — I051 Rheumatic mitral insufficiency: Secondary | ICD-10-CM | POA: Diagnosis not present

## 2023-10-03 DIAGNOSIS — I872 Venous insufficiency (chronic) (peripheral): Secondary | ICD-10-CM | POA: Diagnosis not present

## 2023-10-03 DIAGNOSIS — E041 Nontoxic single thyroid nodule: Secondary | ICD-10-CM | POA: Diagnosis not present

## 2023-10-03 DIAGNOSIS — I69398 Other sequelae of cerebral infarction: Secondary | ICD-10-CM | POA: Diagnosis not present

## 2023-10-03 DIAGNOSIS — R269 Unspecified abnormalities of gait and mobility: Secondary | ICD-10-CM | POA: Diagnosis not present

## 2023-10-03 DIAGNOSIS — F331 Major depressive disorder, recurrent, moderate: Secondary | ICD-10-CM | POA: Diagnosis not present

## 2023-10-03 DIAGNOSIS — I708 Atherosclerosis of other arteries: Secondary | ICD-10-CM | POA: Diagnosis not present

## 2023-10-03 DIAGNOSIS — I69315 Cognitive social or emotional deficit following cerebral infarction: Secondary | ICD-10-CM | POA: Diagnosis not present

## 2023-10-11 DIAGNOSIS — I10 Essential (primary) hypertension: Secondary | ICD-10-CM | POA: Diagnosis not present

## 2023-10-11 DIAGNOSIS — I051 Rheumatic mitral insufficiency: Secondary | ICD-10-CM | POA: Diagnosis not present

## 2023-10-11 DIAGNOSIS — I69315 Cognitive social or emotional deficit following cerebral infarction: Secondary | ICD-10-CM | POA: Diagnosis not present

## 2023-10-11 DIAGNOSIS — I69398 Other sequelae of cerebral infarction: Secondary | ICD-10-CM | POA: Diagnosis not present

## 2023-10-11 DIAGNOSIS — R269 Unspecified abnormalities of gait and mobility: Secondary | ICD-10-CM | POA: Diagnosis not present

## 2023-10-11 DIAGNOSIS — I708 Atherosclerosis of other arteries: Secondary | ICD-10-CM | POA: Diagnosis not present

## 2023-10-11 DIAGNOSIS — I872 Venous insufficiency (chronic) (peripheral): Secondary | ICD-10-CM | POA: Diagnosis not present

## 2023-10-11 DIAGNOSIS — E041 Nontoxic single thyroid nodule: Secondary | ICD-10-CM | POA: Diagnosis not present

## 2023-10-11 DIAGNOSIS — F331 Major depressive disorder, recurrent, moderate: Secondary | ICD-10-CM | POA: Diagnosis not present

## 2023-10-17 DIAGNOSIS — I10 Essential (primary) hypertension: Secondary | ICD-10-CM | POA: Diagnosis not present

## 2023-10-17 DIAGNOSIS — I69315 Cognitive social or emotional deficit following cerebral infarction: Secondary | ICD-10-CM | POA: Diagnosis not present

## 2023-10-17 DIAGNOSIS — F331 Major depressive disorder, recurrent, moderate: Secondary | ICD-10-CM | POA: Diagnosis not present

## 2023-10-17 DIAGNOSIS — I872 Venous insufficiency (chronic) (peripheral): Secondary | ICD-10-CM | POA: Diagnosis not present

## 2023-10-17 DIAGNOSIS — E041 Nontoxic single thyroid nodule: Secondary | ICD-10-CM | POA: Diagnosis not present

## 2023-10-17 DIAGNOSIS — I051 Rheumatic mitral insufficiency: Secondary | ICD-10-CM | POA: Diagnosis not present

## 2023-10-17 DIAGNOSIS — R269 Unspecified abnormalities of gait and mobility: Secondary | ICD-10-CM | POA: Diagnosis not present

## 2023-10-17 DIAGNOSIS — I69398 Other sequelae of cerebral infarction: Secondary | ICD-10-CM | POA: Diagnosis not present

## 2023-10-17 DIAGNOSIS — I708 Atherosclerosis of other arteries: Secondary | ICD-10-CM | POA: Diagnosis not present

## 2023-10-18 DIAGNOSIS — I69315 Cognitive social or emotional deficit following cerebral infarction: Secondary | ICD-10-CM | POA: Diagnosis not present

## 2023-10-18 DIAGNOSIS — I10 Essential (primary) hypertension: Secondary | ICD-10-CM | POA: Diagnosis not present

## 2023-10-18 DIAGNOSIS — I872 Venous insufficiency (chronic) (peripheral): Secondary | ICD-10-CM | POA: Diagnosis not present

## 2023-10-18 DIAGNOSIS — I708 Atherosclerosis of other arteries: Secondary | ICD-10-CM | POA: Diagnosis not present

## 2023-10-18 DIAGNOSIS — R269 Unspecified abnormalities of gait and mobility: Secondary | ICD-10-CM | POA: Diagnosis not present

## 2023-10-18 DIAGNOSIS — I69398 Other sequelae of cerebral infarction: Secondary | ICD-10-CM | POA: Diagnosis not present

## 2023-10-18 DIAGNOSIS — I051 Rheumatic mitral insufficiency: Secondary | ICD-10-CM | POA: Diagnosis not present

## 2023-10-18 DIAGNOSIS — F331 Major depressive disorder, recurrent, moderate: Secondary | ICD-10-CM | POA: Diagnosis not present

## 2023-10-18 DIAGNOSIS — E041 Nontoxic single thyroid nodule: Secondary | ICD-10-CM | POA: Diagnosis not present

## 2023-10-19 DIAGNOSIS — F411 Generalized anxiety disorder: Secondary | ICD-10-CM | POA: Diagnosis not present

## 2023-10-19 DIAGNOSIS — F0150 Vascular dementia without behavioral disturbance: Secondary | ICD-10-CM | POA: Diagnosis not present

## 2023-10-19 DIAGNOSIS — I639 Cerebral infarction, unspecified: Secondary | ICD-10-CM | POA: Diagnosis not present

## 2023-10-19 DIAGNOSIS — G4700 Insomnia, unspecified: Secondary | ICD-10-CM | POA: Diagnosis not present

## 2023-10-19 DIAGNOSIS — F331 Major depressive disorder, recurrent, moderate: Secondary | ICD-10-CM | POA: Diagnosis not present

## 2023-10-19 DIAGNOSIS — I10 Essential (primary) hypertension: Secondary | ICD-10-CM | POA: Diagnosis not present

## 2023-10-26 ENCOUNTER — Ambulatory Visit: Payer: Medicare PPO | Attending: Family Medicine | Admitting: Audiologist

## 2023-10-26 DIAGNOSIS — H6121 Impacted cerumen, right ear: Secondary | ICD-10-CM | POA: Diagnosis not present

## 2023-10-26 NOTE — Procedures (Signed)
  [facility] [address] [place], [place]  [address] [phone]  AUDIOLOGICAL  EVALUATION  NAME: Tamara Andrews     DOB:   19-Apr-1951      MRN: [id number]                                                                                     DATE: 10/26/2023     REFERENT: Lewis Moccasin, MD STATUS: Outpatient DIAGNOSIS: Occluding Cerumen Right Earn   History: [name] was seen for an audiological evaluation due to difficulty hearing people clearly. She feels other people are mumbling. She has no pain pressure or tinnitus. She recently had wax removed from both ears. She said wax build up is a chronic issue due to her small ear canals.    Evaluation:  Otoscopy showed no view of the tympanic membranes due to cerumen, bilaterally Tympanometry results were consistent with flat response with small volume in right ear, right ear impacted, normal middle ear function in left ear  Results:  The test results were reviewed with [name] has impacted wax in the right ear. She needs it removed for an accurate diagnostic hearing test. She was given a handout on Debrox and encouraged to call her PCP for removal.    Recommendations: 1.   Debrox Earwax Removal Drops are a safe and inexpensive in-home solution for wax removal. Debrox Earwax Removal Kit includes a soft rubber bulb syringe to rinse your ear after using Debrox Earwax Removal Drops. Call PCP after Debrox for wax removal.    14 minutes spent testing and counseling on results.   If you have any questions please feel free to contact me at [phone].  Ammie Ferrier Au.D.  Audiologist   10/26/2023  11:19 AM  Cc: Lewis Moccasin, MD

## 2023-10-27 DIAGNOSIS — R269 Unspecified abnormalities of gait and mobility: Secondary | ICD-10-CM | POA: Diagnosis not present

## 2023-10-27 DIAGNOSIS — I69398 Other sequelae of cerebral infarction: Secondary | ICD-10-CM | POA: Diagnosis not present

## 2023-10-27 DIAGNOSIS — I10 Essential (primary) hypertension: Secondary | ICD-10-CM | POA: Diagnosis not present

## 2023-10-27 DIAGNOSIS — I051 Rheumatic mitral insufficiency: Secondary | ICD-10-CM | POA: Diagnosis not present

## 2023-10-27 DIAGNOSIS — F331 Major depressive disorder, recurrent, moderate: Secondary | ICD-10-CM | POA: Diagnosis not present

## 2023-10-27 DIAGNOSIS — I69315 Cognitive social or emotional deficit following cerebral infarction: Secondary | ICD-10-CM | POA: Diagnosis not present

## 2023-10-27 DIAGNOSIS — E041 Nontoxic single thyroid nodule: Secondary | ICD-10-CM | POA: Diagnosis not present

## 2023-10-27 DIAGNOSIS — I708 Atherosclerosis of other arteries: Secondary | ICD-10-CM | POA: Diagnosis not present

## 2023-10-27 DIAGNOSIS — I872 Venous insufficiency (chronic) (peripheral): Secondary | ICD-10-CM | POA: Diagnosis not present

## 2023-10-31 DIAGNOSIS — I69398 Other sequelae of cerebral infarction: Secondary | ICD-10-CM | POA: Diagnosis not present

## 2023-10-31 DIAGNOSIS — I708 Atherosclerosis of other arteries: Secondary | ICD-10-CM | POA: Diagnosis not present

## 2023-10-31 DIAGNOSIS — I69315 Cognitive social or emotional deficit following cerebral infarction: Secondary | ICD-10-CM | POA: Diagnosis not present

## 2023-10-31 DIAGNOSIS — I10 Essential (primary) hypertension: Secondary | ICD-10-CM | POA: Diagnosis not present

## 2023-10-31 DIAGNOSIS — E041 Nontoxic single thyroid nodule: Secondary | ICD-10-CM | POA: Diagnosis not present

## 2023-10-31 DIAGNOSIS — F331 Major depressive disorder, recurrent, moderate: Secondary | ICD-10-CM | POA: Diagnosis not present

## 2023-10-31 DIAGNOSIS — I872 Venous insufficiency (chronic) (peripheral): Secondary | ICD-10-CM | POA: Diagnosis not present

## 2023-10-31 DIAGNOSIS — I051 Rheumatic mitral insufficiency: Secondary | ICD-10-CM | POA: Diagnosis not present

## 2023-10-31 DIAGNOSIS — R269 Unspecified abnormalities of gait and mobility: Secondary | ICD-10-CM | POA: Diagnosis not present

## 2023-11-01 DIAGNOSIS — E785 Hyperlipidemia, unspecified: Secondary | ICD-10-CM | POA: Diagnosis not present

## 2023-11-01 DIAGNOSIS — E559 Vitamin D deficiency, unspecified: Secondary | ICD-10-CM | POA: Diagnosis not present

## 2023-11-01 DIAGNOSIS — R5383 Other fatigue: Secondary | ICD-10-CM | POA: Diagnosis not present

## 2023-11-06 DIAGNOSIS — E782 Mixed hyperlipidemia: Secondary | ICD-10-CM | POA: Diagnosis not present

## 2023-11-06 DIAGNOSIS — I639 Cerebral infarction, unspecified: Secondary | ICD-10-CM | POA: Diagnosis not present

## 2023-11-06 DIAGNOSIS — E559 Vitamin D deficiency, unspecified: Secondary | ICD-10-CM | POA: Diagnosis not present

## 2023-11-06 DIAGNOSIS — H6123 Impacted cerumen, bilateral: Secondary | ICD-10-CM | POA: Diagnosis not present

## 2023-11-06 DIAGNOSIS — I10 Essential (primary) hypertension: Secondary | ICD-10-CM | POA: Diagnosis not present

## 2023-11-06 DIAGNOSIS — Z789 Other specified health status: Secondary | ICD-10-CM | POA: Diagnosis not present

## 2023-11-08 DIAGNOSIS — I708 Atherosclerosis of other arteries: Secondary | ICD-10-CM | POA: Diagnosis not present

## 2023-11-08 DIAGNOSIS — E041 Nontoxic single thyroid nodule: Secondary | ICD-10-CM | POA: Diagnosis not present

## 2023-11-08 DIAGNOSIS — I69315 Cognitive social or emotional deficit following cerebral infarction: Secondary | ICD-10-CM | POA: Diagnosis not present

## 2023-11-08 DIAGNOSIS — F331 Major depressive disorder, recurrent, moderate: Secondary | ICD-10-CM | POA: Diagnosis not present

## 2023-11-08 DIAGNOSIS — I872 Venous insufficiency (chronic) (peripheral): Secondary | ICD-10-CM | POA: Diagnosis not present

## 2023-11-08 DIAGNOSIS — R269 Unspecified abnormalities of gait and mobility: Secondary | ICD-10-CM | POA: Diagnosis not present

## 2023-11-08 DIAGNOSIS — I69398 Other sequelae of cerebral infarction: Secondary | ICD-10-CM | POA: Diagnosis not present

## 2023-11-08 DIAGNOSIS — I10 Essential (primary) hypertension: Secondary | ICD-10-CM | POA: Diagnosis not present

## 2023-11-08 DIAGNOSIS — I051 Rheumatic mitral insufficiency: Secondary | ICD-10-CM | POA: Diagnosis not present

## 2023-11-23 DIAGNOSIS — I10 Essential (primary) hypertension: Secondary | ICD-10-CM | POA: Diagnosis not present

## 2023-11-23 DIAGNOSIS — G4700 Insomnia, unspecified: Secondary | ICD-10-CM | POA: Diagnosis not present

## 2023-11-23 DIAGNOSIS — F411 Generalized anxiety disorder: Secondary | ICD-10-CM | POA: Diagnosis not present

## 2023-11-23 DIAGNOSIS — E559 Vitamin D deficiency, unspecified: Secondary | ICD-10-CM | POA: Diagnosis not present

## 2023-11-23 DIAGNOSIS — F331 Major depressive disorder, recurrent, moderate: Secondary | ICD-10-CM | POA: Diagnosis not present

## 2023-11-23 DIAGNOSIS — F0150 Vascular dementia without behavioral disturbance: Secondary | ICD-10-CM | POA: Diagnosis not present

## 2023-11-27 ENCOUNTER — Encounter: Payer: Self-pay | Admitting: Diagnostic Neuroimaging

## 2023-11-27 ENCOUNTER — Ambulatory Visit: Payer: Medicare PPO | Admitting: Diagnostic Neuroimaging

## 2023-11-27 VITALS — BP 133/67 | HR 56 | Ht 62.0 in | Wt 109.8 lb

## 2023-11-27 DIAGNOSIS — G959 Disease of spinal cord, unspecified: Secondary | ICD-10-CM

## 2023-11-27 DIAGNOSIS — R413 Other amnesia: Secondary | ICD-10-CM

## 2023-11-27 NOTE — Progress Notes (Signed)
[facility]  PATIENT: Tamara Andrews DOB: 27-Nov-1951  REFERRING CLINICIAN: Lewis Moccasin, MD HISTORY FROM: patient and sister REASON FOR VISIT: follow up   HISTORICAL  CHIEF COMPLAINT:  Chief Complaint  Patient presents with   New Patient (Initial Visit)    Pt in 6 with sister Pt states tremors in right Sister states short term memory is worse Pt finds it difficult to form words     HISTORY OF PRESENT ILLNESS:   UPDATE (11/27/23, VRP): Since last visit, now with worsening memory, balance and gait since last 6-12 months. Planning to move to [place] in next few weeks to be closer to family.   PRIOR HPI(07/25/22, VRP): 72 year old female here for evaluation of stroke.  Patient went to hospital on 05/22/2022 for right hand weakness.  Symptoms lasted about an hour.  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  Patient was noted to have some mild intracranial atherosclerotic changes.  Stroke work-up was completed.  Patient was treated with aspirin Plavix for 3 weeks and then aspirin alone.  She is on statin.  On blood pressure control.  Tolerating medications.  No residual symptoms.  Had heart monitor as outpatient.   REVIEW OF SYSTEMS: Full 14 system review of systems performed and negative with exception of: as per HPI.  ALLERGIES: Allergies  Allergen Reactions   Lactose Intolerance (Gi) Diarrhea   Methylprednisolone     Other reaction(s): Other Psychosis    HOME MEDICATIONS: Outpatient Medications Prior to Visit  Medication Sig Dispense Refill   aspirin 81 MG chewable tablet Chew 1 tablet (81 mg total) by mouth daily. 30 tablet 11   calcium carbonate (TUMS - DOSED IN MG ELEMENTAL CALCIUM) 500 MG chewable tablet Chew 1 tablet by mouth as needed.     Cholecalciferol (D3 PO) Take by mouth.     Cyanocobalamin (B-12 PO) Take by mouth.     sertraline (ZOLOFT) 50 MG tablet Take 50 mg by mouth every morning.      valsartan (DIOVAN) 160 MG tablet Take 240 mg by mouth daily. 1.5 tablets     VITAMIN D PO Take 1 tablet by mouth daily. Takes about 4000 units     vitamin E 1000 UNIT capsule Take 1,000 Units by mouth daily.     atorvastatin (LIPITOR) 10 MG tablet Take 1 tablet (10 mg total) by mouth at bedtime. 30 tablet 11   benzonatate (TESSALON) 100 MG capsule Take 1 capsule (100 mg total) by mouth every 8 (eight) hours. 21 capsule 0   famotidine (PEPCID) 40 MG tablet Take 40 mg by mouth daily. (Patient not taking: Reported on 11/27/2023)     Sod Picosulfate-Mag Ox-Cit Acd (CLENPIQ) 10-3.5-12 MG-GM -GM/175ML SOLN Take by mouth.     No facility-administered medications prior to visit.    PAST MEDICAL HISTORY: Past Medical History:  Diagnosis Date   Asthma    Essential hypertension    Osteoporosis    Stroke (HCC)    Vertigo     PAST SURGICAL HISTORY: Past Surgical History:  Procedure Laterality Date   LUNG SURGERY     age 5    FAMILY HISTORY: Family History  Problem Relation Age of Onset   Heart attack Mother    Stroke Father    Breast cancer Sister        dx's early 69's   Parkinson's disease Neg Hx    Dementia Neg Hx     SOCIAL  HISTORY: Social History   Socioeconomic History   Marital status: Single    Spouse name: Not on file   Number of children: 0   Years of education: Not on file   Highest education level: Not on file  Occupational History   Not on file  Tobacco Use   Smoking status: Never   Smokeless tobacco: Never  Vaping Use   Vaping status: Never Used  Substance and Sexual Activity   Alcohol use: Not Currently   Drug use: Never   Sexual activity: Not on file  Other Topics Concern   Not on file  Social History Narrative   Lives alone   Retired    International aid/development worker of [employer] Strain: Not on file  Food Insecurity: Not on file  Transportation Needs: Not on file  Physical Activity: Not on file  Stress: Not on file  Social  Connections: Not on file  Intimate Partner Violence: Not on file     PHYSICAL EXAM  GENERAL EXAM/CONSTITUTIONAL: Vitals:  Vitals:   11/27/23 1426  BP: 133/67  Pulse: (!) 56  Weight: 109 lb 12.8 oz (49.8 kg)  Height: 5\' 2"  (1.575 m)   Body mass index is 20.08 kg/m. Wt Readings from Last 3 Encounters:  11/27/23 109 lb 12.8 oz (49.8 kg)  08/19/22 115 lb (52.2 kg)  07/27/22 113 lb 12.8 oz (51.6 kg)   Patient is in no distress; well developed, nourished and groomed; neck is supple  CARDIOVASCULAR: Examination of carotid arteries is normal; no carotid bruits Regular rate and rhythm, BLOWING SYS MURMUR OVER LEFT PRECORDIAL REGION Examination of peripheral vascular system by observation and palpation is normal  EYES: Ophthalmoscopic exam of optic discs and posterior segments is normal; no papilledema or hemorrhages No results found.  MUSCULOSKELETAL: Gait, strength, tone, movements noted in Neurologic exam below  NEUROLOGIC: MENTAL STATUS:     11/27/2023    3:14 PM  MMSE - Mini Mental State Exam  Orientation to time 5  Orientation to Place 5  Registration 3  Attention/ Calculation 2  Recall 3  Language- name 2 objects 2  Language- repeat 1  Language- follow 3 step command 3  Language- read & follow direction 1  Write a sentence 1  Copy design 1  Total score 27   awake, alert, oriented to person, place and time recent and remote memory intact normal attention and concentration language fluent, comprehension intact, naming intact fund of knowledge appropriate  CRANIAL NERVE:  2nd - no papilledema on fundoscopic exam 2nd, 3rd, 4th, 6th - pupils equal and reactive to light, visual fields full to confrontation, extraocular muscles intact, no nystagmus 5th - facial sensation symmetric 7th - facial strength symmetric 8th - hearing intact 9th - palate elevates symmetrically, uvula midline 11th - shoulder shrug symmetric 12th - tongue protrusion midline  MOTOR:   SLIGHT INCREASED TONE IN BUE MILD POSTURAL TREMOR SLOW MOVEMENTS IN BUE AND BLE normal bulk and tone, full strength in the BUE, BLE  SENSORY:  normal and symmetric to light touch, temperature, vibration  COORDINATION:  finger-nose-finger, fine finger movements normal  REFLEXES:  deep tendon reflexes --> BRISK (3+ in BUE and KNEES)  GAIT/STATION:  narrow based gait; SHORT STEPS; SLOW TO STAND; STOOPED POSTURE; UNSTEADY; USING CANE     DIAGNOSTIC DATA (LABS, IMAGING, TESTING) - I reviewed patient records, labs, notes, testing and imaging myself where available.  Lab Results  Component Value Date   WBC 3.7 (L) 05/23/2022  HGB 12.9 05/23/2022   HCT 38.6 05/23/2022   MCV 90.2 05/23/2022   PLT 135 (L) 05/23/2022      Component Value Date/Time   NA 140 05/23/2022 0503   K 4.3 05/23/2022 0503   CL 109 05/23/2022 0503   CO2 26 05/23/2022 0503   GLUCOSE 81 05/23/2022 0503   BUN 13 05/23/2022 0503   CREATININE 0.80 05/23/2022 0503   CALCIUM 9.3 05/23/2022 0503   PROT 6.5 05/23/2022 0503   ALBUMIN 3.0 (L) 05/23/2022 0503   AST 22 05/23/2022 0503   ALT 27 05/23/2022 0503   ALKPHOS 51 05/23/2022 0503   BILITOT 0.6 05/23/2022 0503   GFRNONAA >60 05/23/2022 0503   Lab Results  Component Value Date   CHOL 140 05/22/2022   HDL 54 05/22/2022   LDLCALC 76 05/22/2022   TRIG 48 05/22/2022   CHOLHDL 2.6 05/22/2022   Lab Results  Component Value Date   HGBA1C 5.3 05/22/2022   No results found for: "VITAMINB12" No results found for: "TSH"  05/22/22 MRI brain 1. Subtle acute cortical/subcortical infarct in the Left Superior Perirolandic Cortex (near right upper extremity representation area). Underlying moderate chronic cerebral white matter changes. No associated hemorrhage or mass effect. 2. No other acute intracranial abnormality. 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   05/22/22 CTA head / neck 1. Abnormal Posterior Circulation, with Absent intracranial Right Vertebral Artery and Moderately irregular and stenotic Basilar artery. 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 But given right cerebellar infarcts on earlier Head CT (age indeterminate), a Right Vertebral Artery Occlusion (also age indeterminate) is not excluded. Follow-up Brain MRI (noncontrast should suffice) recommended to try to clarify. 2. The left vertebral is patent without stenosis and supplies the Basilar, and there are Fetal Type Bilateral PCA origins. 3. Mild for age Carotid and Anterior circulation plaque with no stenosis. 4. Indeterminate heterogeneous bone mineralization. Recommend correlation with SPEP/UPEP to exclude the possibility of Multiple Myeloma. 5. Left thyroid 16 mm nodule. Recommend thyroid US (ref: J Am Coll Radiol. 2015 Feb;12(2): 143-50).  07/20/22 cardiac monitoring   Sinus rhythm including NSR, sinus bradycardia and sinus tachycardia   No serious arrhythmias observed.  05/22/22 TTE 1. Left ventricular ejection fraction, by estimation, is 60 to 65%. The  left ventricle has normal function. The left ventricle has no regional  wall motion abnormalities. There is mild left ventricular hypertrophy.  Left ventricular diastolic parameters  are indeterminate.   2. Right ventricular systolic function is normal. The right ventricular  size is normal. There is normal pulmonary artery systolic pressure. The  estimated right ventricular systolic pressure is 26.2 mmHg.   3. The mitral valve is normal in structure. Trivial mitral valve  regurgitation. No evidence of mitral stenosis.   4. The aortic valve is tricuspid. Aortic valve regurgitation is not  visualized. No aortic stenosis is present.   5. The inferior vena cava is  normal in size with greater than 50%  respiratory variability, suggesting right atrial pressure of 3 mmHg.   6. Agitated saline contrast bubble study was positive with shunting  observed within 3-6 cardiac cycles suggestive of interatrial shunt.  Technically difficult bubble study, but appears to be small amount of  shunting after ~5 cardiac cycles    Carotid u/s  BILATERAL:  -  No evidence of deep vein thrombosis seen in the lower extremities,  bilaterally.  -No evidence of popliteal cyst, bilaterally.        ASSESSMENT AND PLAN  72 y.o. year old female here with:   Dx:  1. Cervical myelopathy (HCC)   2. Memory loss      PLAN:  GAIT DIFFICULTY (slow short steps, bradykinesia, increased tone and hyperreflexia; ddx: vascular event vs neurodegenerative --> parkinsonism, DLB, PSP) - check MRI brain and cervical spine - use rollator walker; fall precautions  MEMORY LOSS (suspect mild dementia; short term memory loss; getting lost driving) - check MRI brain, B12, TSH, ATN panel  SYSTOLIC HEART MURMUR (noted in 2023 also) - follow up with cardiology  STROKE PREVENTION (left frontal cortical June 2023; likely artery-artery embolism vs small vessel thrombosis) - continue aspirin 81mg  daily monotherapy (completed aspirin + plavix x 3 weeks) - continue lipitor 10 mg nightly - BP control  Orders Placed This Encounter  Procedures   MR CERVICAL SPINE WO CONTRAST   MR BRAIN W WO CONTRAST   ATN PROFILE   Vitamin B12   TSH Rfx on Abnormal to Free T4   Return for pending if symptoms worsen or fail to improve, pending test results.    Suanne Marker, MD 11/27/2023, 3:00 PM Certified in Neurology, Neurophysiology and Neuroimaging  [facility] [address] [place], [place] [address] [phone]

## 2023-11-27 NOTE — Patient Instructions (Signed)
  GAIT DIFFICULTY (slow short steps, bradykinesia, increased tone and hyperreflexia) - check MRI brain and cervical spine - use rollator walker; fall precautions  MEMORY LOSS (suspect mild-moderate dementia; short term memory loss; getting lost driving) - check MMSE, check MRI brain, B12, TSH, ATN panel  SYSTOLIC HEART MURMUR (noted in 2023 also) - follow up with cardiology

## 2023-11-28 LAB — TSH RFX ON ABNORMAL TO FREE T4: TSH: 0.77 u[IU]/mL (ref 0.450–4.500)

## 2023-11-29 ENCOUNTER — Emergency Department (HOSPITAL_BASED_OUTPATIENT_CLINIC_OR_DEPARTMENT_OTHER)
Admission: EM | Admit: 2023-11-29 | Discharge: 2023-11-29 | Disposition: A | Discharge status: Home / Self Care | Payer: Medicare PPO | Attending: Emergency Medicine | Admitting: Emergency Medicine

## 2023-11-29 ENCOUNTER — Emergency Department (HOSPITAL_BASED_OUTPATIENT_CLINIC_OR_DEPARTMENT_OTHER): Payer: Medicare PPO

## 2023-11-29 ENCOUNTER — Encounter (HOSPITAL_BASED_OUTPATIENT_CLINIC_OR_DEPARTMENT_OTHER): Payer: Self-pay | Admitting: Urology

## 2023-11-29 DIAGNOSIS — Z7982 Long term (current) use of aspirin: Secondary | ICD-10-CM | POA: Diagnosis not present

## 2023-11-29 DIAGNOSIS — W01198A Fall on same level from slipping, tripping and stumbling with subsequent striking against other object, initial encounter: Secondary | ICD-10-CM | POA: Diagnosis not present

## 2023-11-29 DIAGNOSIS — S199XXA Unspecified injury of neck, initial encounter: Secondary | ICD-10-CM | POA: Diagnosis not present

## 2023-11-29 DIAGNOSIS — R220 Localized swelling, mass and lump, head: Secondary | ICD-10-CM | POA: Diagnosis not present

## 2023-11-29 DIAGNOSIS — M47812 Spondylosis without myelopathy or radiculopathy, cervical region: Secondary | ICD-10-CM | POA: Diagnosis not present

## 2023-11-29 DIAGNOSIS — Z79899 Other long term (current) drug therapy: Secondary | ICD-10-CM | POA: Diagnosis not present

## 2023-11-29 DIAGNOSIS — W19XXXA Unspecified fall, initial encounter: Secondary | ICD-10-CM

## 2023-11-29 DIAGNOSIS — S0990XA Unspecified injury of head, initial encounter: Secondary | ICD-10-CM | POA: Insufficient documentation

## 2023-11-29 DIAGNOSIS — I10 Essential (primary) hypertension: Secondary | ICD-10-CM | POA: Insufficient documentation

## 2023-11-29 MED ORDER — ACETAMINOPHEN 325 MG PO TABS
650.0000 mg | ORAL_TABLET | Freq: Once | ORAL | Status: AC
  Administered 2023-11-29: 650 mg via ORAL
  Filled 2023-11-29: qty 2

## 2023-11-29 NOTE — ED Provider Notes (Signed)
[facility] Provider Note   CSN: [id number] Arrival date & time: 11/29/23  1637     History  Chief Complaint  Patient presents with   Fall   Head Injury    Tamara Andrews is a 72 y.o. female history of stroke, hypertension presented after a fall that occurred at 3:50 PM that was witnessed today by her sister.  Patient tripped [place] curb [place] the [place].  Patient fell forward and bumped the right side of her head but denies LOC, blood thinners, bleeding disorders, head or neck pain.  Patient's been walking at baseline since then and been acting normal according to sister.  Patient does have a bump on her right forehead that they want evaluated.  Patient denies chest pain, shortness of breath, abdominal pain, nauseous vomiting, back pain, pelvis pain, paresthesias, new onset weakness, recent illnesses, dysuria  Home Medications Prior to Admission medications   Medication Sig Start Date End Date Taking? Authorizing Provider  aspirin 81 MG chewable tablet Chew 1 tablet (81 mg total) by mouth daily. 05/24/22   Danford, Earl Lites, MD  atorvastatin (LIPITOR) 10 MG tablet Take 1 tablet (10 mg total) by mouth at bedtime. 05/23/22 05/23/23  Danford, Earl Lites, MD  calcium carbonate (TUMS - DOSED IN MG ELEMENTAL CALCIUM) 500 MG chewable tablet Chew 1 tablet by mouth as needed.    [provider]  Cholecalciferol (D3 PO) Take by mouth.    [provider]  Cyanocobalamin (B-12 PO) Take by mouth.    [provider]  sertraline (ZOLOFT) 50 MG tablet Take 50 mg by mouth every morning. 04/18/22   [provider]  valsartan (DIOVAN) 160 MG tablet Take 240 mg by mouth daily. 1.5 tablets    [provider]  VITAMIN D PO Take 1 tablet by mouth daily. Takes about 4000 units    [provider]  vitamin E 1000 UNIT capsule Take 1,000 Units by mouth daily.    [provider]       Allergies    Lactose intolerance (gi) and Methylprednisolone    Review of Systems   Review of Systems  Physical Exam Updated Vital Signs BP (!) 165/68 (BP Location: Right Arm)   Pulse 60   Temp 97.8 F (36.6 C) (Oral)   Resp 16   Ht 5\' 2"  (1.575 m)   Wt 49.8 kg   SpO2 100%   BMI 20.08 kg/m  Physical Exam Vitals reviewed.  Constitutional:      General: She is not in acute distress. HENT:     Head: Normocephalic.     Comments: Hematoma to right forehead No crepitus or bony tenderness abnormalities noted Eyes:     Extraocular Movements: Extraocular movements intact.     Conjunctiva/sclera: Conjunctivae normal.     Pupils: Pupils are equal, round, and reactive to light.  Cardiovascular:     Rate and Rhythm: Normal rate and regular rhythm.     Pulses: Normal pulses.     Heart sounds: Normal heart sounds.     Comments: 2+ bilateral radial/dorsalis pedis pulses with regular rate Pulmonary:     Effort: Pulmonary effort is normal. No respiratory distress.     Breath sounds: Normal breath sounds.  Abdominal:     General: There is no distension.     Palpations: Abdomen is soft.     Tenderness: There is no abdominal tenderness. There is no guarding or rebound.  Musculoskeletal:  General: Normal range of motion.     Cervical back: Normal range of motion and neck supple.     Comments: 5 out of 5 bilateral grip/leg extension strength No midline tenderness or abnormalities Pelvis stable Soft compartments Pain not out of portion  Skin:    General: Skin is warm and dry.     Capillary Refill: Capillary refill takes less than 2 seconds.  Neurological:     General: No focal deficit present.     Mental Status: She is alert and oriented to person, place, and time.     Sensory: Sensation is intact.     Motor: Motor function is intact.     Coordination: Coordination is intact.     Gait: Gait is intact.     Comments: Sensation intact in all 4 limbs Cranial nerves III  through XII intact Vision grossly intact  Psychiatric:        Mood and Affect: Mood normal.     ED Results / Procedures / Treatments   Labs (all labs ordered are listed, but only abnormal results are displayed) Labs Reviewed - No data to display  EKG None  Radiology CT Cervical Spine Wo Contrast  Result Date: 11/29/2023 CLINICAL DATA:  Neck trauma EXAM: CT CERVICAL SPINE WITHOUT CONTRAST TECHNIQUE: Multidetector CT imaging of the cervical spine was performed without intravenous contrast. Multiplanar CT image reconstructions were also generated. RADIATION DOSE REDUCTION: This exam was performed according to the departmental dose-optimization program which includes automated exposure control, adjustment of the mA and/or kV according to patient size and/or use of iterative reconstruction technique. COMPARISON:  None Available. FINDINGS: Alignment: Normal Skull base and vertebrae: Normal.  No regional fracture. Soft tissues and spinal canal: No traumatic soft tissue finding. Disc levels: Mild degenerative spondylosis. No canal or foraminal stenosis. No significant facet arthritis. Upper chest: Negative Other: None IMPRESSION: No acute or traumatic finding. Mild degenerative spondylosis. Electronically Signed   By: Paulina Fusi M.D.   On: 11/29/2023 18:22   CT Head Wo Contrast  Result Date: 11/29/2023 CLINICAL DATA:  Head trauma EXAM: CT HEAD WITHOUT CONTRAST TECHNIQUE: Contiguous axial images were obtained from the base of the skull through the vertex without intravenous contrast. RADIATION DOSE REDUCTION: This exam was performed according to the departmental dose-optimization program which includes automated exposure control, adjustment of the mA and/or kV according to patient size and/or use of iterative reconstruction technique. COMPARISON:  09/01/2023 FINDINGS: Brain: Old right cerebellar infarction. Few old small vessel cerebellar infarctions in the left cerebellar hemisphere. Cerebral  hemispheres show atrophy with chronic small-vessel ischemic changes of the white matter. No sign of acute infarction, mass lesion, hemorrhage, hydrocephalus or extra-axial collection. 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 No mass-effect upon the brain. Vascular: There is atherosclerotic calcification of the major vessels at the base of the brain. Skull: No skull fracture. Sinuses/Orbits: Clear/normal Other: Mild soft tissue swelling of the right frontal scalp. IMPRESSION: 1. No acute intracranial finding. Mild soft tissue swelling of the right frontal scalp. No skull fracture. 2. Old right cerebellar infarction. Few old small vessel cerebellar infarctions in the left cerebellar hemisphere. Atrophy with chronic small-vessel ischemic changes of the cerebral hemispheric white matter. 3. 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 No mass-effect upon the brain. Electronically Signed   By: Paulina Fusi M.D.   On: 11/29/2023 18:21    Procedures Procedures    Medications Ordered in ED Medications  acetaminophen (TYLENOL) tablet 650  mg (has no administration in time range)    ED Course/ Medical Decision Making/ A&P                                 Medical Decision Making Amount and/or Complexity of Data Reviewed Radiology: ordered.  Risk OTC drugs.   Cliffton Asters 72 y.o. presented today for fall. Working DDx that I considered at this time includes, but not limited to, vasovagal episode, mechanical fall, ICH, epidural/subdural hematoma, basilar skull fracture, anemia, electrolyte abnormalities, drug-induced, arrhythmia, UTI, fracture, contusion, soft tissue injury.  R/o DDx: vasovagal episode, ICH, epidural/subdural hematoma, basilar skull fracture, anemia, electrolyte abnormalities, drug-induced, arrhythmia, UTI, fracture: These are considered less likely due to history of present illness, physical exam, lab/imaging findings  Review of prior  external notes: 06/12/2023 ED  Unique Tests and My Interpretation:  CT head without contrast: No acute findings CT cervical spine without contrast: Mild arthritis  Social Determinants of Health: none  Discussion with Independent Historian:  Sister  Discussion of Management of Tests: None  Risk: Medium: prescription drug management  Risk Stratification Score: None  Plan: On exam patient was in no acute distress with stable vitals.  Patient does have hematoma to right forehead but otherwise unremarkable physical exam.  Imaging from triage was ultimately negative.  This was a mechanical fall that was witnessed and so with reassuring physical exam do not feel patient needs a larger workup at this time.  Will give patient Tylenol and icing her to follow-up with her primary care provider and use Tylenol every 6 hours as needed for pain.  Patient and sister agree that they do not need further workup at this time and are happy to be discharged and follow-up outpatient.  Patient was given return precautions. Patient stable for discharge at this time.  Patient verbalized understanding of plan.  This chart was dictated using voice recognition software.  Despite best efforts to proofread,  errors can occur which can change the documentation meaning.         Final Clinical Impression(s) / ED Diagnoses Final diagnoses:  Injury of head, initial encounter  Fall, initial encounter    Rx / DC Orders ED Discharge Orders     None         Remi Deter 11/29/23 1916    Sloan Leiter, DO 12/02/23 [phone]

## 2023-11-29 NOTE — Discharge Instructions (Signed)
Please follow-up with your care provider in regards recent ER visit.  Today your labs imaging are reassuring you have a bump from your fall.  Please ice 3-4 times daily for 15 minutes at a time.  You may take Tylenol every 6 hours needed for pain.  If symptoms change or worsen please return to the ER.

## 2023-11-29 NOTE — ED Triage Notes (Signed)
Mechanical fall today, tripped, hit forehead  Hematoma noted  No lac or bleeding noted   Does not take blood thinners

## 2023-11-30 LAB — ATN PROFILE
A -- Beta-amyloid 42/40 Ratio: 0.106 (ref >0.102)
Beta-amyloid 40: 182.7 pg/mL
Beta-amyloid 42: 19.41 pg/mL
N -- NfL, Plasma: 4.58 pg/mL (ref 0.00–7.64)
T -- p-tau181: 1.57 pg/mL — ABNORMAL HIGH (ref 0.00–0.97)

## 2023-11-30 LAB — VITAMIN B12: Vitamin B-12: >2000 pg/mL — ABNORMAL HIGH (ref 232–1245)

## 2023-12-04 ENCOUNTER — Ambulatory Visit: Payer: Medicare PPO | Attending: Family Medicine | Admitting: Audiologist

## 2023-12-04 ENCOUNTER — Telehealth: Payer: Self-pay | Admitting: Diagnostic Neuroimaging

## 2023-12-04 DIAGNOSIS — H903 Sensorineural hearing loss, bilateral: Secondary | ICD-10-CM | POA: Insufficient documentation

## 2023-12-04 NOTE — Telephone Encounter (Signed)
Ethlyn Gallery: [phone] exp. 12/04/23-02/02/24 sent to [facility] [phone]

## 2023-12-04 NOTE — Procedures (Signed)
  Outpatient Audiology and [facility] [address] [place], [place]  [address] [phone]  AUDIOLOGICAL  EVALUATION  NAME: Tamara Andrews     DOB:   May 25, 1951      MRN: [id number]                                                                                     DATE: 12/04/2023     REFERENT: Lewis Moccasin, MD STATUS: Outpatient DIAGNOSIS: Sensorineural Hearing Loss Bilateral    History: [name] was seen for an audiological evaluation due to difficulty hearing. [name] has had a decline in her memory. [name] feels like people are mumbling. Her family notices she also seems to not be hearing well. She was seen by audiology 10/26/2023 and had impacted wax in the right ear. This has been removed her Dr. Duanne Guess.  [name] denies pain, pressure, or tinnitus. [name] has history of hazardous noise exposure.   Evaluation:  Otoscopy showed a slight view of the tympanic membranes, bilaterally Tympanometry results were consistent with flat response in right ear and shallow response in left ear Audiometric testing was completed using Conventional Audiometry techniques with insert earphones and supraural headphones. Test results are consistent with mild sloping to severe sensorineural  hearing loss bilaterally. Speech Recognition Thresholds were obtained at 35dB HL in the right ear and at 35dB HL in the left ear. Word Recognition Testing was completed at  40dB SL and [name] scored 100% in each ear.    Results:  The test results were reviewed with [name] and her sister. [name] has a mild sloping to severe sensorineural hearing loss in each ear. They were counseled on hearing aids. Local provider list given to patient. She is ready to try hearing aids.  Audiogram printed and provided to [name].    Recommendations: Hearing aids recommended for both ears. Patient given list of local hearing aid providers.    38 minutes spent testing and counseling on  results.   If you have any questions please feel free to contact me at [phone].  Ammie Ferrier Au.D.  Audiologist   12/04/2023  1:55 PM  Cc: Lewis Moccasin, MD

## 2023-12-05 DIAGNOSIS — F331 Major depressive disorder, recurrent, moderate: Secondary | ICD-10-CM | POA: Diagnosis not present

## 2023-12-05 DIAGNOSIS — D329 Benign neoplasm of meninges, unspecified: Secondary | ICD-10-CM | POA: Diagnosis not present

## 2023-12-05 DIAGNOSIS — G4700 Insomnia, unspecified: Secondary | ICD-10-CM | POA: Diagnosis not present

## 2023-12-05 DIAGNOSIS — I639 Cerebral infarction, unspecified: Secondary | ICD-10-CM | POA: Diagnosis not present

## 2023-12-05 DIAGNOSIS — H9190 Unspecified hearing loss, unspecified ear: Secondary | ICD-10-CM | POA: Diagnosis not present

## 2023-12-05 DIAGNOSIS — W19XXXA Unspecified fall, initial encounter: Secondary | ICD-10-CM | POA: Diagnosis not present

## 2023-12-07 NOTE — Telephone Encounter (Signed)
She will need to cancel her appointment at [facility] when she schedules at [facility].

## 2023-12-07 NOTE — Telephone Encounter (Signed)
Patient called me and requested the MRI orders be sent to the [facility] facility at [facility]. I changed the location on the PA and faxed the orders. [phone]

## 2023-12-14 DIAGNOSIS — K219 Gastro-esophageal reflux disease without esophagitis: Secondary | ICD-10-CM | POA: Diagnosis not present

## 2023-12-14 DIAGNOSIS — I10 Essential (primary) hypertension: Secondary | ICD-10-CM | POA: Diagnosis not present

## 2023-12-14 DIAGNOSIS — F0390 Unspecified dementia without behavioral disturbance: Secondary | ICD-10-CM | POA: Diagnosis not present

## 2023-12-29 ENCOUNTER — Inpatient Hospital Stay: Admission: RE | Admit: 2023-12-29 | Payer: Medicare PPO | Source: Ambulatory Visit

## 2024-01-14 ENCOUNTER — Other Ambulatory Visit: Payer: Medicare PPO

## 2024-01-16 IMAGING — CT CT HEAD W/O CM
3 series · 16 of 47 positions shown, 19 images · non-contrast
Comparison: None Available.

CLINICAL DATA: Dizziness with right extremity weakness.



[Series 2: head wo · axial · 0.40mm/px · z∈[-216,-86]mm · 10 of 32 slices shown, 13 images]
[im 3/32  brain]
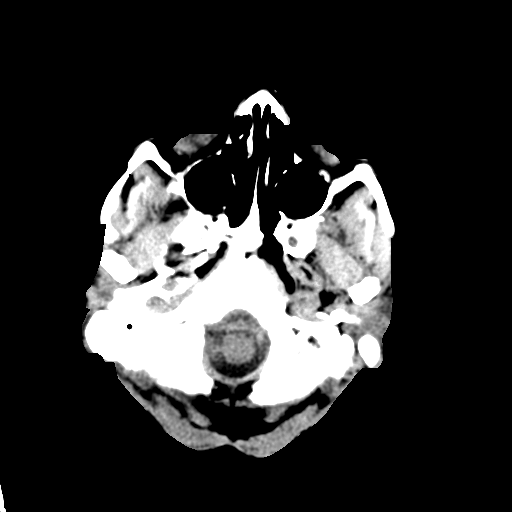
[im 3/32  bone]
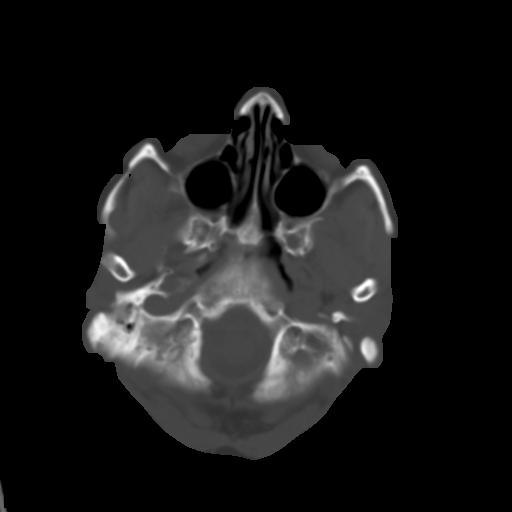
[im 6/32  brain]
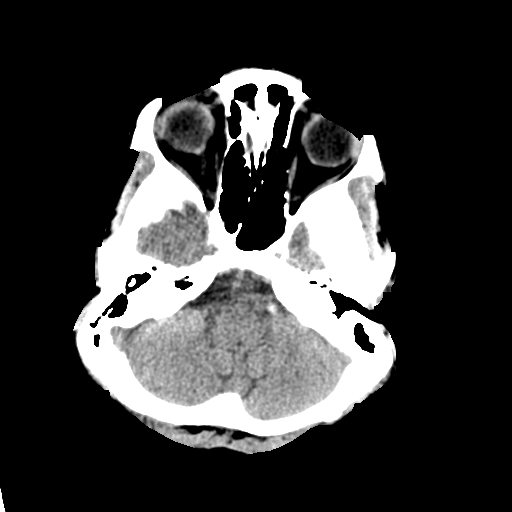
[im 9/32  brain]
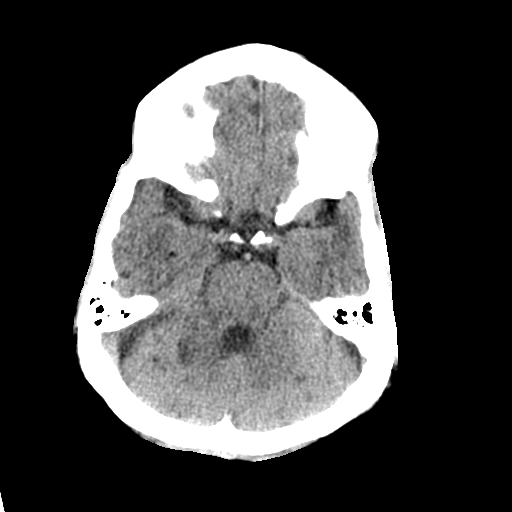
[im 11/32  brain]
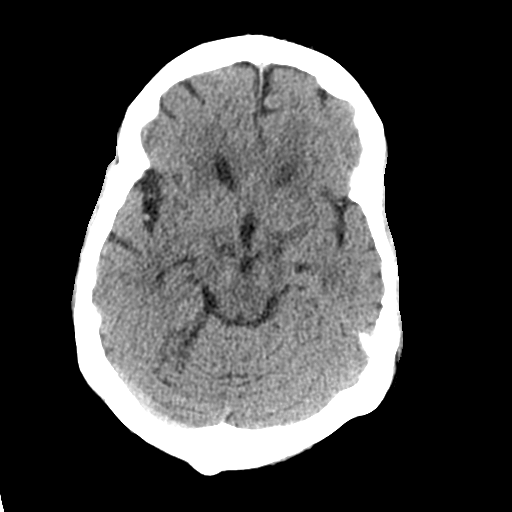
[im 14/32  brain]
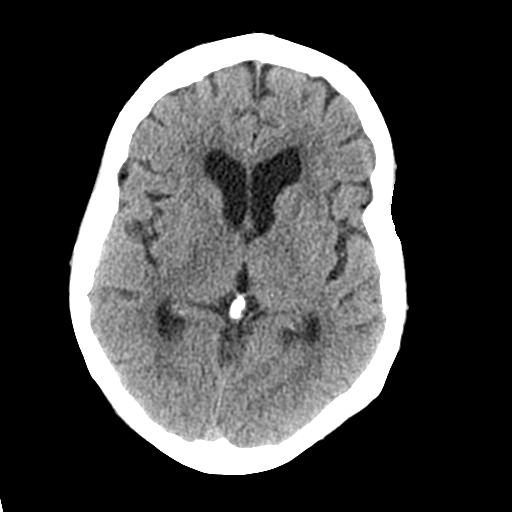
[im 14/32  bone]
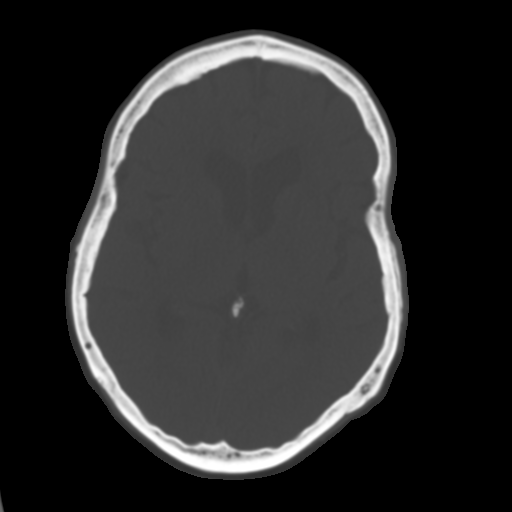
[im 18/32  brain]
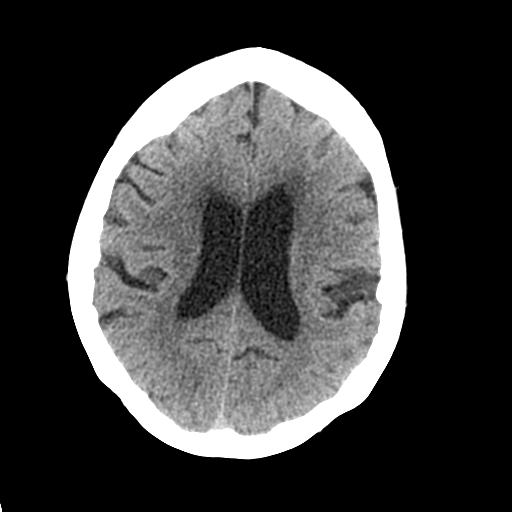
[im 21/32  brain]
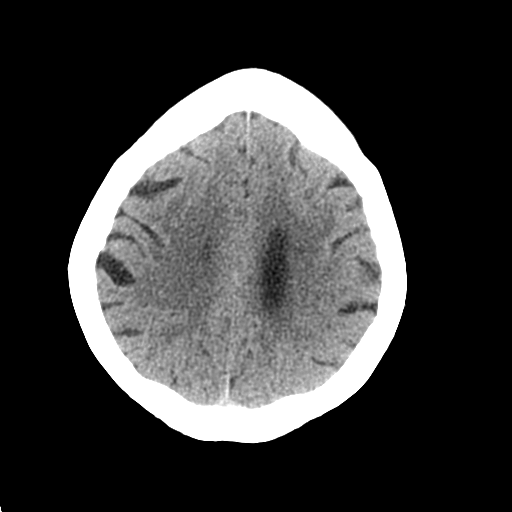
[im 24/32  brain]
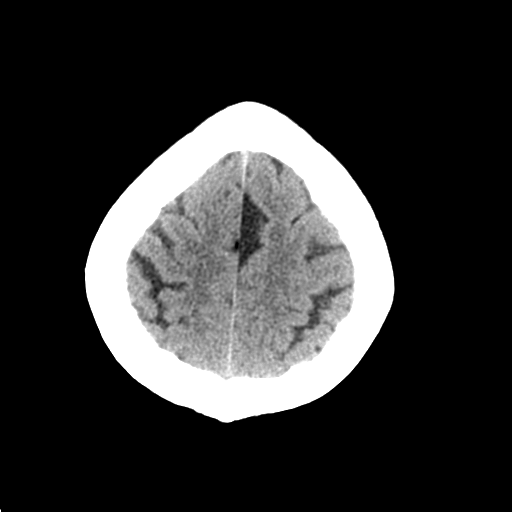
[im 26/32  brain]
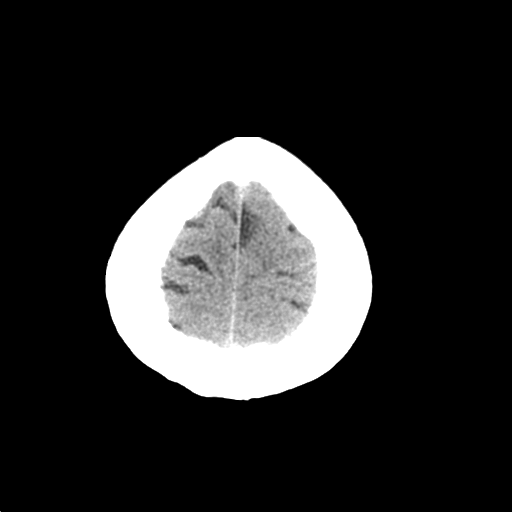
[im 26/32  bone]
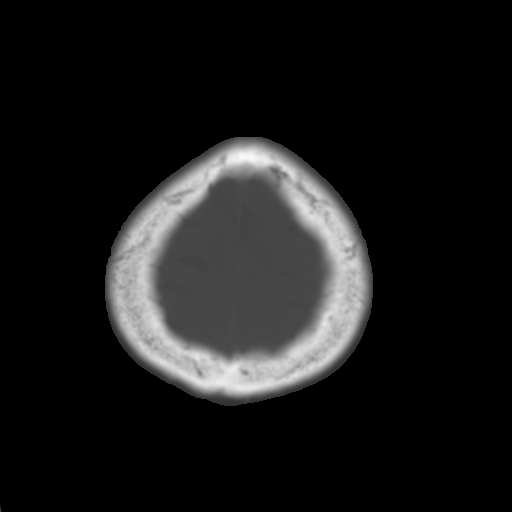
[im 29/32  brain]
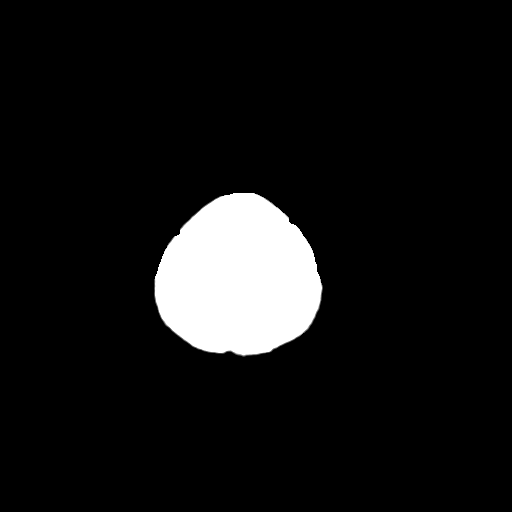

[Series 5: coronal soft tissue · coronal · 0.31mm/px · 3 of 64 slices shown]
[im 22/64  brain]
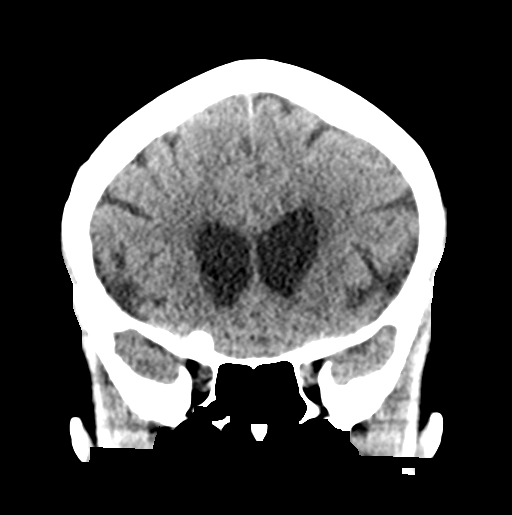
[im 29/64  brain]
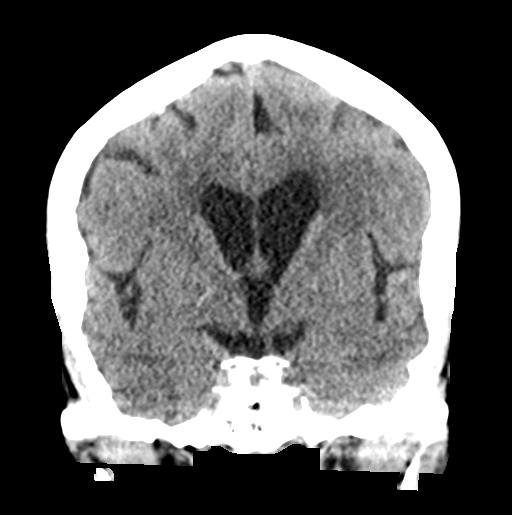
[im 36/64  brain]
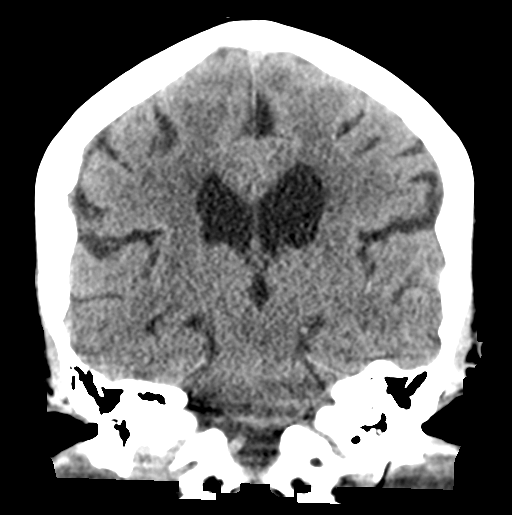

[Series 6: sagittal soft tissue · sagittal · 0.36mm/px · 3 of 54 slices shown]
[im 18/54  brain]
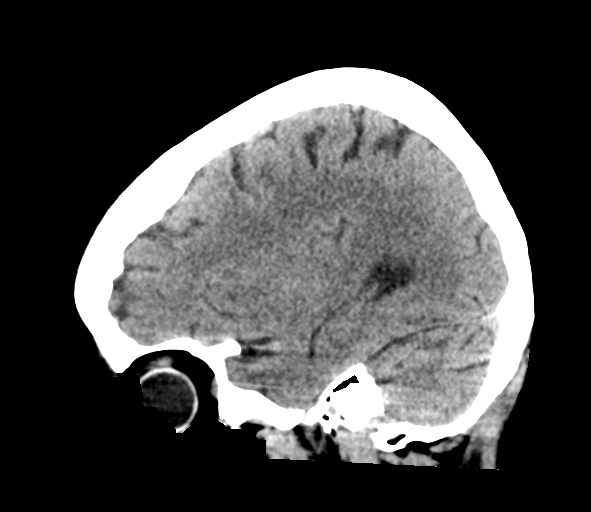
[im 27/54  brain]
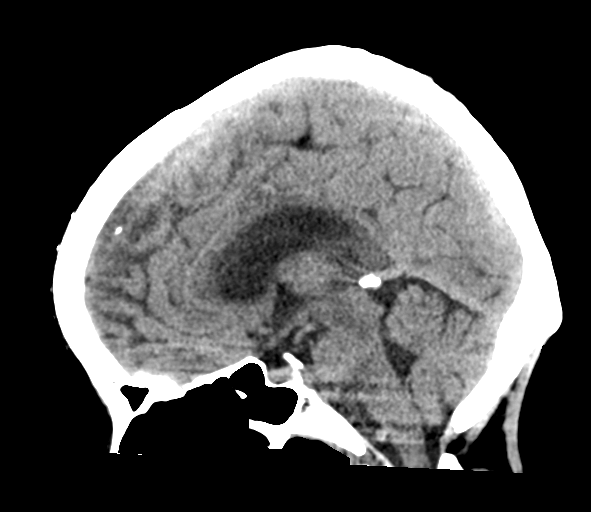
[im 36/54  brain]
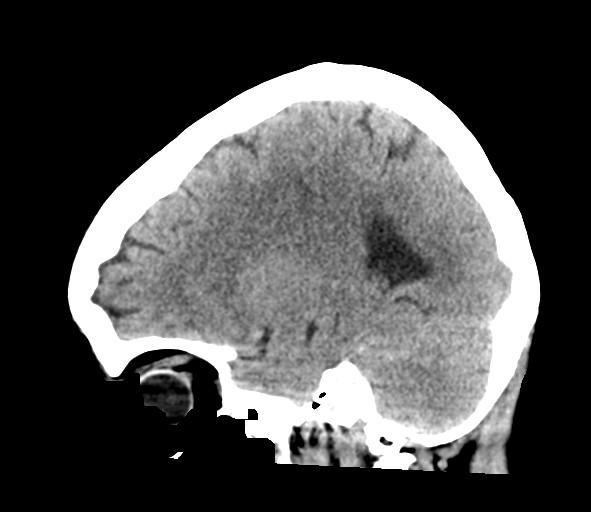

[16 of 47 positions shown; findings below may reference images not displayed]

FINDINGS: Brain: There is mild cerebral atrophy with widening of the
extra-axial spaces and ventricular dilatation.
There are areas of decreased attenuation within the white matter
tracts of the supratentorial brain, consistent with microvascular
disease changes.

Chronic bilateral cerebellar infarcts are seen with an additional
chronic infarct noted along the parasagittal region of the left
frontal lobe, near the vertex.

Vascular: No hyperdense vessel or unexpected calcification.

Skull: Normal. Negative for fracture or focal lesion.

Sinuses/Orbits: No acute finding.

Other: None.
IMPRESSION: 1. Generalized cerebral atrophy and chronic white matter small
vessel ischemic changes without evidence of an acute intracranial
abnormality.
2. Chronic left frontal lobe and bilateral cerebellar infarcts. MRI
correlation is recommended.

## 2024-01-16 IMAGING — CT CT ANGIO HEAD
2 of 7 series · 8 of 33 positions shown · non-contrast
Comparison: Plain head CT 5493 hours.
COMPARISON: Plain head CT 5493 hours.

Addendum:
CLINICAL DATA: 61-year-old female with dizziness and right
extremity weakness. Cerebellar infarcts on plain head CT.

EXAM:
CT ANGIOGRAPHY HEAD AND NECK
TECHNIQUE: Multidetector CT imaging of the head and neck was performed using
the standard protocol during bolus administration of intravenous
contrast. Multiplanar CT image reconstructions and MIPs were
obtained to evaluate the vascular anatomy. Carotid stenosis
measurements (when applicable) are obtained utilizing NASCET
criteria, using the distal internal carotid diameter as the
denominator.

[Series 4: cta head neck · axial · 0.51mm/px · z∈[-170,-56]mm · 2 of 172 slices shown]
[im 58/172  soft-tissue]
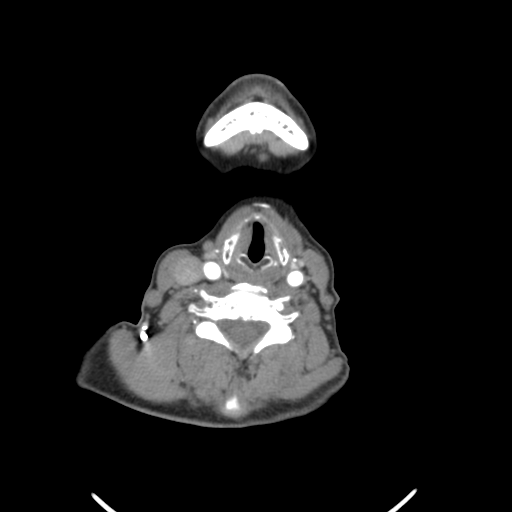
[im 115/172  bone]
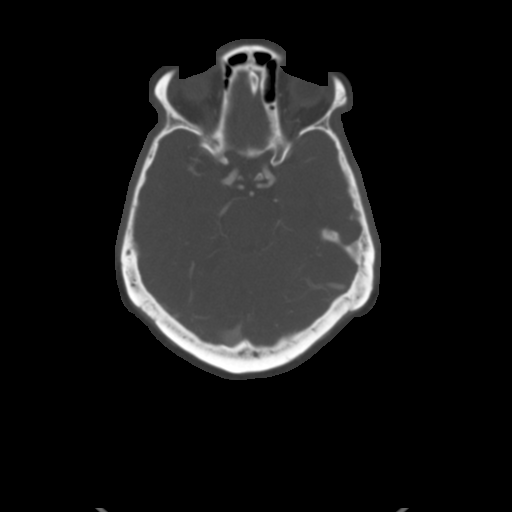

[Series 6: ax thin · axial · 0.51mm/px · z∈[-234,+8]mm · 6 of 341 slices shown]
[im 49/341  soft-tissue]
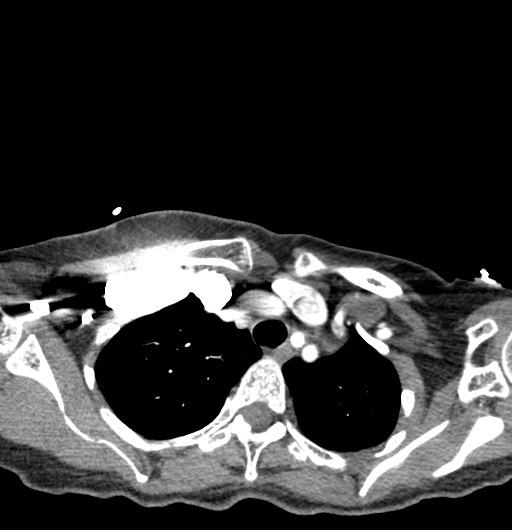
[im 98/341  soft-tissue]
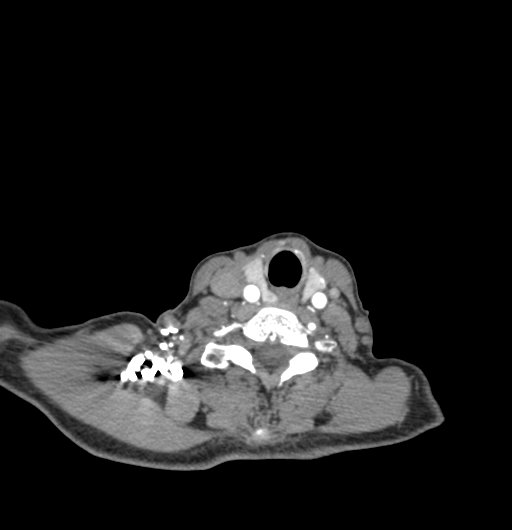
[im 146/341  soft-tissue]
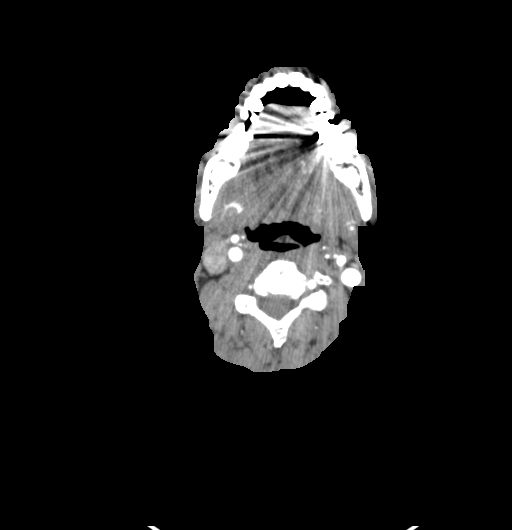
[im 195/341  soft-tissue]
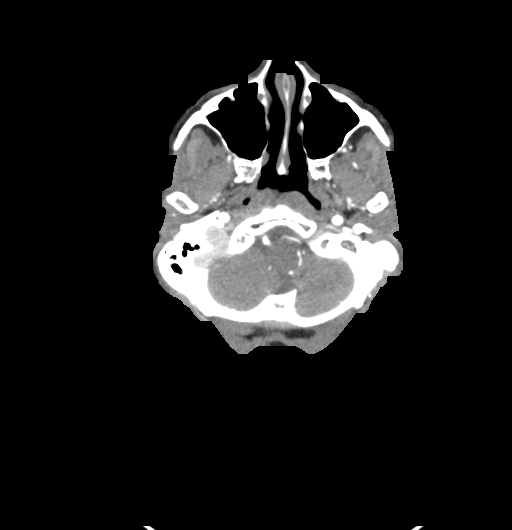
[im 243/341  soft-tissue]
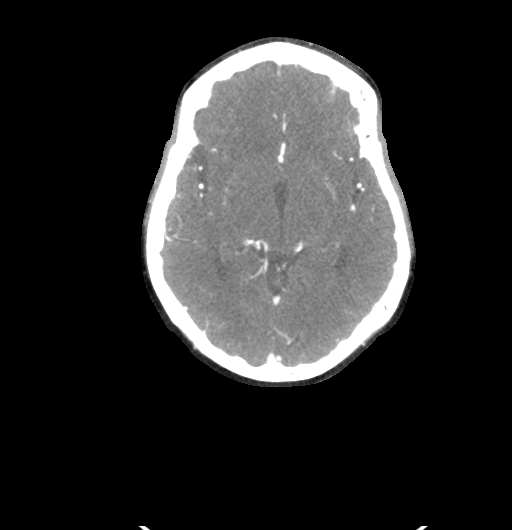
[im 292/341  soft-tissue]
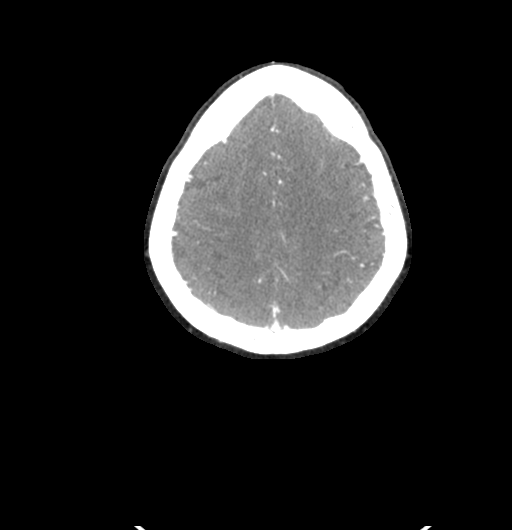

[8 of 33 positions shown; findings below may reference images not displayed]

RADIATION DOSE REDUCTION: This exam was performed according to the
departmental dose-optimization program which includes automated
exposure control, adjustment of the mA and/or kV according to
patient size and/or use of iterative reconstruction technique.

CONTRAST:  75mL OMNIPAQUE IOHEXOL 350 MG/ML SOLN
FINDINGS: CTA NECK

Skeleton: Diffusely heterogeneous bone mineralization. Granular
appearance of the spinal vertebrae, ribs. But no overt lytic osseous
lesion identified.

Upper chest: Negative; no superior mediastinal lymphadenopathy.

Other neck: Hyperdense or hyperenhancing 16 mm left thyroid nodule
on series 4, image 131. Otherwise within normal limits.

Aortic arch: 3 vessel arch configuration with mild arch tortuosity.
Minimal arch atherosclerosis.

Right carotid system: Proximal right CCA partially obscured by dense
right subclavian venous contrast streak artifact, but appears
normal. Negative right carotid bifurcation. Mildly tortuous cervical
right ICA.

Left carotid system: Negative left CCA. Calcified plaque at the
posterior left ICA origin with no significant stenosis.

Vertebral arteries:
No normal origin of the right vertebral artery. But no occluded
origin is evident. Little to no proximal right subclavian plaque.
Highly diminutive right vertebral V2 segment appears reconstituted
from thyrocervical and muscular branches, and right cervical
transverse foramen is diminutive on series 6, image 201. Thread-like
right vertebral artery functionally terminates outside the skull.

Normal proximal left subclavian artery and left vertebral artery
origin. Dominant but fairly normal caliber left vertebral artery is
patent to the skull base with some tortuosity but no plaque or
stenosis.

CTA HEAD

Posterior circulation: Fairly normal caliber left vertebral artery
supplies the basilar. Normal left PICA origin. Patent right AICA
appears to supply the right PICA. Patent basilar artery, but
moderate irregularity and stenosis of the mid basilar as seen on
series 10, image 22. Superimposed fetal type bilateral PCA origins.
Basilar tip and SCA origins remain patent. Bilateral PCA branches
are within normal limits.

Anterior circulation: Both ICA siphons are patent. There is
relatively mild bilateral siphon calcified plaque and no significant
siphon stenosis. Both posterior communicating artery origins are
normal. Patent carotid termini, MCA and ACA origins. Diminutive or
absent anterior communicating artery. Bilateral ACA branches are
within normal limits. Left MCA M1 segment and trifurcation are
patent without stenosis. Right MCA M1 segment and trifurcation are
patent without stenosis. Bilateral MCA branches are within normal
limits.

Venous sinuses: Early contrast timing, grossly patent superior
sagittal sinus and transverse sinuses.

Anatomic variants: Dominant appearing left vertebral artery.

Review of the MIP images confirms the above findings
IMPRESSION: 1. Abnormal Posterior Circulation, with Absent intracranial Right
Vertebral Artery and Moderately irregular and stenotic Basilar
artery. However, there is also evidence of congenital hypoplasia of
the right vertebral artery - in addition to other posterior
circulation variation (see #2).
But given right cerebellar infarcts on earlier Head CT (age
indeterminate), a Right Vertebral Artery Occlusion (also age
indeterminate) is not excluded.
Follow-up Brain MRI (noncontrast should suffice) recommended to try
to clarify.

2. The left vertebral is patent without stenosis and supplies the
Basilar, and there are Fetal Type Bilateral PCA origins.

3. Mild for age Carotid and Anterior circulation plaque with no
stenosis.

ADDENDUM:
There should also be additional impressions reading:

4. Indeterminate heterogeneous bone mineralization. Recommend
correlation with SPEP/UPEP to exclude the possibility of Multiple
Myeloma.

5. Left thyroid 16 mm nodule. Recommend thyroid US (ref: [HOSPITAL]. [DATE]): 143-50).

*** End of Addendum ***
RADIATION DOSE REDUCTION: This exam was performed according to the
departmental dose-optimization program which includes automated
exposure control, adjustment of the mA and/or kV according to
patient size and/or use of iterative reconstruction technique.

CONTRAST:  75mL OMNIPAQUE IOHEXOL 350 MG/ML SOLN
FINDINGS: CTA NECK

Skeleton: Diffusely heterogeneous bone mineralization. Granular
appearance of the spinal vertebrae, ribs. But no overt lytic osseous
lesion identified.

Upper chest: Negative; no superior mediastinal lymphadenopathy.

Other neck: Hyperdense or hyperenhancing 16 mm left thyroid nodule
on series 4, image 131. Otherwise within normal limits.

Aortic arch: 3 vessel arch configuration with mild arch tortuosity.
Minimal arch atherosclerosis.

Right carotid system: Proximal right CCA partially obscured by dense
right subclavian venous contrast streak artifact, but appears
normal. Negative right carotid bifurcation. Mildly tortuous cervical
right ICA.

Left carotid system: Negative left CCA. Calcified plaque at the
posterior left ICA origin with no significant stenosis.

Vertebral arteries:
No normal origin of the right vertebral artery. But no occluded
origin is evident. Little to no proximal right subclavian plaque.
Highly diminutive right vertebral V2 segment appears reconstituted
from thyrocervical and muscular branches, and right cervical
transverse foramen is diminutive on series 6, image 201. Thread-like
right vertebral artery functionally terminates outside the skull.

Normal proximal left subclavian artery and left vertebral artery
origin. Dominant but fairly normal caliber left vertebral artery is
patent to the skull base with some tortuosity but no plaque or
stenosis.

CTA HEAD

Posterior circulation: Fairly normal caliber left vertebral artery
supplies the basilar. Normal left PICA origin. Patent right AICA
appears to supply the right PICA. Patent basilar artery, but
moderate irregularity and stenosis of the mid basilar as seen on
series 10, image 22. Superimposed fetal type bilateral PCA origins.
Basilar tip and SCA origins remain patent. Bilateral PCA branches
are within normal limits.

Anterior circulation: Both ICA siphons are patent. There is
relatively mild bilateral siphon calcified plaque and no significant
siphon stenosis. Both posterior communicating artery origins are
normal. Patent carotid termini, MCA and ACA origins. Diminutive or
absent anterior communicating artery. Bilateral ACA branches are
within normal limits. Left MCA M1 segment and trifurcation are
patent without stenosis. Right MCA M1 segment and trifurcation are
patent without stenosis. Bilateral MCA branches are within normal
limits.

Venous sinuses: Early contrast timing, grossly patent superior
sagittal sinus and transverse sinuses.

Anatomic variants: Dominant appearing left vertebral artery.

Review of the MIP images confirms the above findings
IMPRESSION: 1. Abnormal Posterior Circulation, with Absent intracranial Right
Vertebral Artery and Moderately irregular and stenotic Basilar
artery. However, there is also evidence of congenital hypoplasia of
the right vertebral artery - in addition to other posterior
circulation variation (see #2).
But given right cerebellar infarcts on earlier Head CT (age
indeterminate), a Right Vertebral Artery Occlusion (also age
indeterminate) is not excluded.
Follow-up Brain MRI (noncontrast should suffice) recommended to try
to clarify.

2. The left vertebral is patent without stenosis and supplies the
Basilar, and there are Fetal Type Bilateral PCA origins.

3. Mild for age Carotid and Anterior circulation plaque with no
stenosis.

## 2024-01-16 IMAGING — CT CT ANGIO NECK
1 series · 1 of 1 positions shown · non-contrast
Comparison: Plain head CT 5493 hours.
COMPARISON: Plain head CT 5493 hours.

Addendum:
CLINICAL DATA: 61-year-old female with dizziness and right
extremity weakness. Cerebellar infarcts on plain head CT.

EXAM:
CT ANGIOGRAPHY HEAD AND NECK
TECHNIQUE: Multidetector CT imaging of the head and neck was performed using
the standard protocol during bolus administration of intravenous
contrast. Multiplanar CT image reconstructions and MIPs were
obtained to evaluate the vascular anatomy. Carotid stenosis
measurements (when applicable) are obtained utilizing NASCET
criteria, using the distal internal carotid diameter as the
denominator.

[Series 1: topogram 0.6 t20f · sagittal · 1.00mm/px · 1 of 1 slices shown]
[im 1/1]
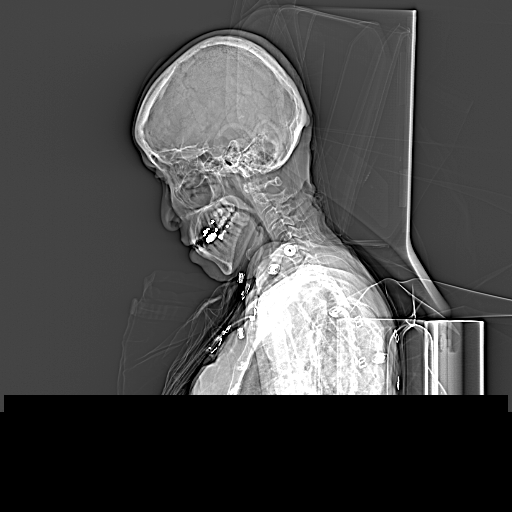

[1 of 1 positions shown; findings below may reference images not displayed]

RADIATION DOSE REDUCTION: This exam was performed according to the
departmental dose-optimization program which includes automated
exposure control, adjustment of the mA and/or kV according to
patient size and/or use of iterative reconstruction technique.

CONTRAST:  75mL OMNIPAQUE IOHEXOL 350 MG/ML SOLN
FINDINGS: CTA NECK

Skeleton: Diffusely heterogeneous bone mineralization. Granular
appearance of the spinal vertebrae, ribs. But no overt lytic osseous
lesion identified.

Upper chest: Negative; no superior mediastinal lymphadenopathy.

Other neck: Hyperdense or hyperenhancing 16 mm left thyroid nodule
on series 4, image 131. Otherwise within normal limits.

Aortic arch: 3 vessel arch configuration with mild arch tortuosity.
Minimal arch atherosclerosis.

Right carotid system: Proximal right CCA partially obscured by dense
right subclavian venous contrast streak artifact, but appears
normal. Negative right carotid bifurcation. Mildly tortuous cervical
right ICA.

Left carotid system: Negative left CCA. Calcified plaque at the
posterior left ICA origin with no significant stenosis.

Vertebral arteries:
No normal origin of the right vertebral artery. But no occluded
origin is evident. Little to no proximal right subclavian plaque.
Highly diminutive right vertebral V2 segment appears reconstituted
from thyrocervical and muscular branches, and right cervical
transverse foramen is diminutive on series 6, image 201. Thread-like
right vertebral artery functionally terminates outside the skull.

Normal proximal left subclavian artery and left vertebral artery
origin. Dominant but fairly normal caliber left vertebral artery is
patent to the skull base with some tortuosity but no plaque or
stenosis.

CTA HEAD

Posterior circulation: Fairly normal caliber left vertebral artery
supplies the basilar. Normal left PICA origin. Patent right AICA
appears to supply the right PICA. Patent basilar artery, but
moderate irregularity and stenosis of the mid basilar as seen on
series 10, image 22. Superimposed fetal type bilateral PCA origins.
Basilar tip and SCA origins remain patent. Bilateral PCA branches
are within normal limits.

Anterior circulation: Both ICA siphons are patent. There is
relatively mild bilateral siphon calcified plaque and no significant
siphon stenosis. Both posterior communicating artery origins are
normal. Patent carotid termini, MCA and ACA origins. Diminutive or
absent anterior communicating artery. Bilateral ACA branches are
within normal limits. Left MCA M1 segment and trifurcation are
patent without stenosis. Right MCA M1 segment and trifurcation are
patent without stenosis. Bilateral MCA branches are within normal
limits.

Venous sinuses: Early contrast timing, grossly patent superior
sagittal sinus and transverse sinuses.

Anatomic variants: Dominant appearing left vertebral artery.

Review of the MIP images confirms the above findings
IMPRESSION: 1. Abnormal Posterior Circulation, with Absent intracranial Right
Vertebral Artery and Moderately irregular and stenotic Basilar
artery. However, there is also evidence of congenital hypoplasia of
the right vertebral artery - in addition to other posterior
circulation variation (see #2).
But given right cerebellar infarcts on earlier Head CT (age
indeterminate), a Right Vertebral Artery Occlusion (also age
indeterminate) is not excluded.
Follow-up Brain MRI (noncontrast should suffice) recommended to try
to clarify.

2. The left vertebral is patent without stenosis and supplies the
Basilar, and there are Fetal Type Bilateral PCA origins.

3. Mild for age Carotid and Anterior circulation plaque with no
stenosis.

ADDENDUM:
There should also be additional impressions reading:

4. Indeterminate heterogeneous bone mineralization. Recommend
correlation with SPEP/UPEP to exclude the possibility of Multiple
Myeloma.

5. Left thyroid 16 mm nodule. Recommend thyroid US (ref: [HOSPITAL]. [DATE]): 143-50).

*** End of Addendum ***
RADIATION DOSE REDUCTION: This exam was performed according to the
departmental dose-optimization program which includes automated
exposure control, adjustment of the mA and/or kV according to
patient size and/or use of iterative reconstruction technique.

CONTRAST:  75mL OMNIPAQUE IOHEXOL 350 MG/ML SOLN
FINDINGS: CTA NECK

Skeleton: Diffusely heterogeneous bone mineralization. Granular
appearance of the spinal vertebrae, ribs. But no overt lytic osseous
lesion identified.

Upper chest: Negative; no superior mediastinal lymphadenopathy.

Other neck: Hyperdense or hyperenhancing 16 mm left thyroid nodule
on series 4, image 131. Otherwise within normal limits.

Aortic arch: 3 vessel arch configuration with mild arch tortuosity.
Minimal arch atherosclerosis.

Right carotid system: Proximal right CCA partially obscured by dense
right subclavian venous contrast streak artifact, but appears
normal. Negative right carotid bifurcation. Mildly tortuous cervical
right ICA.

Left carotid system: Negative left CCA. Calcified plaque at the
posterior left ICA origin with no significant stenosis.

Vertebral arteries:
No normal origin of the right vertebral artery. But no occluded
origin is evident. Little to no proximal right subclavian plaque.
Highly diminutive right vertebral V2 segment appears reconstituted
from thyrocervical and muscular branches, and right cervical
transverse foramen is diminutive on series 6, image 201. Thread-like
right vertebral artery functionally terminates outside the skull.

Normal proximal left subclavian artery and left vertebral artery
origin. Dominant but fairly normal caliber left vertebral artery is
patent to the skull base with some tortuosity but no plaque or
stenosis.

CTA HEAD

Posterior circulation: Fairly normal caliber left vertebral artery
supplies the basilar. Normal left PICA origin. Patent right AICA
appears to supply the right PICA. Patent basilar artery, but
moderate irregularity and stenosis of the mid basilar as seen on
series 10, image 22. Superimposed fetal type bilateral PCA origins.
Basilar tip and SCA origins remain patent. Bilateral PCA branches
are within normal limits.

Anterior circulation: Both ICA siphons are patent. There is
relatively mild bilateral siphon calcified plaque and no significant
siphon stenosis. Both posterior communicating artery origins are
normal. Patent carotid termini, MCA and ACA origins. Diminutive or
absent anterior communicating artery. Bilateral ACA branches are
within normal limits. Left MCA M1 segment and trifurcation are
patent without stenosis. Right MCA M1 segment and trifurcation are
patent without stenosis. Bilateral MCA branches are within normal
limits.

Venous sinuses: Early contrast timing, grossly patent superior
sagittal sinus and transverse sinuses.

Anatomic variants: Dominant appearing left vertebral artery.

Review of the MIP images confirms the above findings
IMPRESSION: 1. Abnormal Posterior Circulation, with Absent intracranial Right
Vertebral Artery and Moderately irregular and stenotic Basilar
artery. However, there is also evidence of congenital hypoplasia of
the right vertebral artery - in addition to other posterior
circulation variation (see #2).
But given right cerebellar infarcts on earlier Head CT (age
indeterminate), a Right Vertebral Artery Occlusion (also age
indeterminate) is not excluded.
Follow-up Brain MRI (noncontrast should suffice) recommended to try
to clarify.

2. The left vertebral is patent without stenosis and supplies the
Basilar, and there are Fetal Type Bilateral PCA origins.

3. Mild for age Carotid and Anterior circulation plaque with no
stenosis.

## 2024-02-06 DIAGNOSIS — F01A0 Vascular dementia, mild, without behavioral disturbance, psychotic disturbance, mood disturbance, and anxiety: Secondary | ICD-10-CM | POA: Diagnosis not present

## 2024-02-06 DIAGNOSIS — F411 Generalized anxiety disorder: Secondary | ICD-10-CM | POA: Diagnosis not present

## 2024-02-06 DIAGNOSIS — I10 Essential (primary) hypertension: Secondary | ICD-10-CM | POA: Diagnosis not present

## 2024-02-06 DIAGNOSIS — Z1159 Encounter for screening for other viral diseases: Secondary | ICD-10-CM | POA: Diagnosis not present

## 2024-02-07 DIAGNOSIS — I10 Essential (primary) hypertension: Secondary | ICD-10-CM | POA: Diagnosis not present

## 2024-02-07 DIAGNOSIS — Z1159 Encounter for screening for other viral diseases: Secondary | ICD-10-CM | POA: Diagnosis not present
